# Patient Record
Sex: Female | Born: 1960 | Race: White | Hispanic: No | Smoking: Current every day smoker
Health system: Southern US, Community
[De-identification: ages and names within clinical notes are randomized; demographics above are authoritative.]

## PROBLEM LIST (undated history)

## (undated) DIAGNOSIS — F329 Major depressive disorder, single episode, unspecified: Secondary | ICD-10-CM

## (undated) DIAGNOSIS — E538 Deficiency of other specified B group vitamins: Secondary | ICD-10-CM

## (undated) DIAGNOSIS — E039 Hypothyroidism, unspecified: Secondary | ICD-10-CM

## (undated) DIAGNOSIS — F32A Depression, unspecified: Secondary | ICD-10-CM

## (undated) DIAGNOSIS — I10 Essential (primary) hypertension: Secondary | ICD-10-CM

## (undated) DIAGNOSIS — F419 Anxiety disorder, unspecified: Secondary | ICD-10-CM

## (undated) DIAGNOSIS — Q8501 Neurofibromatosis, type 1: Secondary | ICD-10-CM

## (undated) DIAGNOSIS — E669 Obesity, unspecified: Secondary | ICD-10-CM

## (undated) HISTORY — DX: Neurofibromatosis, type 1: Q85.01

## (undated) HISTORY — DX: Deficiency of other specified B group vitamins: E53.8

## (undated) HISTORY — PX: ABDOMINAL HYSTERECTOMY: SHX81

## (undated) HISTORY — PX: TUBAL LIGATION: SHX77

## (undated) HISTORY — PX: ELBOW SURGERY: SHX618

## (undated) HISTORY — DX: Obesity, unspecified: E66.9

## (undated) HISTORY — DX: Essential (primary) hypertension: I10

## (undated) HISTORY — PX: SALPINGOOPHORECTOMY: SHX82

## (undated) HISTORY — PX: THYROID SURGERY: SHX805

## (undated) HISTORY — DX: Anxiety disorder, unspecified: F41.9

## (undated) HISTORY — PX: PARTIAL HYSTERECTOMY: SHX80

## (undated) HISTORY — DX: Major depressive disorder, single episode, unspecified: F32.9

## (undated) HISTORY — DX: Depression, unspecified: F32.A

---

## 1995-07-20 HISTORY — PX: CHOLECYSTECTOMY: SHX55

## 2007-11-19 ENCOUNTER — Ambulatory Visit: Payer: Self-pay | Admitting: Cardiology

## 2007-12-08 ENCOUNTER — Ambulatory Visit: Payer: Self-pay | Admitting: Cardiology

## 2008-03-15 ENCOUNTER — Ambulatory Visit (HOSPITAL_COMMUNITY): Admission: RE | Admit: 2008-03-15 | Discharge: 2008-03-15 | Payer: Self-pay | Admitting: Internal Medicine

## 2010-12-01 NOTE — Assessment & Plan Note (Signed)
[facility]                          [facility] OFFICE NOTE   NAME:HODGEJood, Retana                           MRN:          [id number]  DATE:12/08/2007                            DOB:          [birth year]    PRIMARY CARDIOLOGIST:  Dr. Lewayne Bunting.   PRIMARY CARE PHYSICIAN:  Dr. Elfredia Nevins.   REASON FOR VISIT:  Followup cardiac testing.   HISTORY OF PRESENT ILLNESS:  Ms. Debruler is a pleasant 50 year old woman  evaluated by Dr. Andee Lineman as an inpatient consult back in early May.  She  presented at that time with left arm discomfort in the setting of known  tobacco abuse and findings of hypertension.  She also had a family  history of cardiovascular disease and neurofibromatosis.  She ruled out  for myocardial infarction and it was felt unlikely that the patient's  symptoms were clearly ischemic, although she was referred for followup  testing.  This included an echocardiogram which demonstrated a normal  left ventricular ejection fraction of 50-55% with mild mitral  regurgitation.  She also had an adenosine Cardiolite which showed no  electrocardiographic changes to suggest ischemia.  There were no  reversible perfusion defects to indicate ischemia.  Ejection fraction by  that particular study was 63%.  She comes back in the office today  stating that generally she has felt fairly well.  Her symptoms have  improved.  She is not reporting any frank chest pain.  She is due to see  Dr. Sherwood Gambler go back for full examination in the future.  She had some  questions about followup of her thyroid.  She apparently had some type  of limited thyroidectomy in the past.  I see that she had a normal TSH  back in 2004 but none recently checked.  I also spoke with her about  having her lipids checked at some point.  Electrocardiogram today shows  sinus rhythm with no acute ST-T wave changes.   ALLERGIES:  CODEINE.   MEDICATIONS:  1. Lexapro 10 mg p.o. daily.  2.  Alprazolam 1 mg p.o. p.r.n.  3. Aspirin 81 mg p.o. daily.   REVIEW OF SYSTEMS:  As in the history of present illness.  Otherwise,  negative.   EXAMINATION:  Blood pressure is 114/69 heart 78, weight is 160 pounds.  The patient is comfortable in no acute distress.  Examination neck reveals no elevated jugular venous pressure no loud  bruits.  LUNGS:  Clear without labored breathing.  CARDIAC:  Exam reveals regular rate and rhythm.  No pathologic murmur,  rub, or gallop.  EXTREMITIES:  No pitting edema.   IMPRESSION/RECOMMENDATIONS:  History of left arm discomfort, most likely  noncardiac.  The patient ruled out for myocardial infarction during  observation in the hospital and had subsequent reassuring outpatient  evaluation including a Cardiolite without evidence of ischemia and  definition of normal ventricle systolic function with only mild mitral  regurgitation.  I would recommend basic risk factor modification  strategies including smoking cessation, followup of blood pressure which  is actually normal today off medications,  and followup of lipid status.  Would also consider a followup TSH given her reported history.  She is  due to see Dr. Sherwood Gambler back in the near future and she will return to see  Dr. Andee Lineman as needed.     Jonelle Sidle, MD  Electronically Signed    SGM/MedQ  DD: 12/08/2007  DT: 12/08/2007  Job #: [id number]   cc:   Madelin Rear. Sherwood Gambler, MD  Learta Codding, MD,FACC

## 2011-04-08 ENCOUNTER — Telehealth: Payer: Self-pay

## 2011-04-08 NOTE — Telephone Encounter (Signed)
LMOM for pt to call. Referred for screening colonoscopy from Dr. Regino Schultze. Needs OV prior to scheduling due to medications.

## 2011-04-08 NOTE — Telephone Encounter (Signed)
Pt returned call and is scheduled for OV with Lorenza Burton, NP on 04/15/2011 @ 8:30 AM.

## 2011-04-15 ENCOUNTER — Encounter: Payer: Self-pay | Admitting: Urgent Care

## 2011-04-15 ENCOUNTER — Ambulatory Visit (INDEPENDENT_AMBULATORY_CARE_PROVIDER_SITE_OTHER): Payer: 59 | Admitting: Urgent Care

## 2011-04-15 VITALS — BP 150/100 | HR 87 | Temp 97.0°F | Ht 61.0 in | Wt 191.0 lb

## 2011-04-15 DIAGNOSIS — K921 Melena: Secondary | ICD-10-CM

## 2011-04-15 NOTE — Assessment & Plan Note (Addendum)
Cassandra Davidson is a 50 y.o. female  Here to set up colonoscopy.  Upon further questioning, she has had some hematochezia that is small volume.  Colonoscopy w/ Dr Darrick Penna in the near future.  Differentials include colorectal ca or polyp vs benign anorectal source.  I have discussed risks & benefits which include, but are not limited to, bleeding, infection, perforation & drug reaction.  The patient agrees with this plan & written consent will be obtained.  Procedure will need to be done with deep sedation (propofol) in the OR under the direction of anesthesia services for  Multiple psychoactive medications.  Please call Dr Edison Simon office TODAY to let them know about your high blood pressure to see if they want to change your medications or make an appt!

## 2011-04-15 NOTE — Progress Notes (Addendum)
Referring Provider: Justin Mend, [facility] Primary Care Physician:  Kirk Ruths, MD Primary Gastroenterologist:  Dr. Darrick Penna  Chief Complaint  Patient presents with  . Colonoscopy    HPI:  Cassandra Davidson is a 50 y.o. female here as a referral from Dr. Regino Schultze for screening colonoscopy.   Occasionally urgency w/ greasy foods since Gallbladder removed.   Denies any upper GI symptoms including heartburn, indigestion, nausea, vomiting, dysphagia, odynophagia or anorexia. Has seen small amts of burgundy blood on toilet paper w/ wiping x2 episodes.  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  Wt gain steadily.  Denies constipation.    Past Medical History  Diagnosis Date  . B12 deficiency   . Obesity   . Anxiety   . Von Recklinghausen's disease   . Neurofibromatosis   . Depression     Past Surgical History  Procedure Date  . Cholecystectomy 1997  . Partial hysterectomy   . Thyroid surgery     nodule  . Salpingoophorectomy     right  . Elbow surgery   . Tubal ligation     Current Outpatient Prescriptions  Medication Sig Dispense Refill  . ACETAMINOPHEN PO Take 500 mg by mouth as needed.        . ALPRAZolam (XANAX) 1 MG tablet Take 1 mg by mouth at bedtime as needed.       Marland Kitchen escitalopram (LEXAPRO) 20 MG tablet Take 20 mg by mouth daily.       . hydrochlorothiazide (MICROZIDE) 12.5 MG capsule Take 12.5 mg by mouth every morning.       . phentermine 37.5 MG capsule Take 37.5 mg by mouth. Every other day       . zolpidem (AMBIEN) 10 MG tablet Take 10 mg by mouth at bedtime as needed.         Allergies as of 04/15/2011 - Review Complete 04/15/2011  Allergen Reaction Noted  . Codeine Itching 04/15/2011    Family History:There is no known family history of colorectal carcinoma , liver disease, or inflammatory bowel disease.  Problem Relation Age of Onset  . Leukemia Father   . Heart failure Mother   . Uterine cancer Mother     History   Social History  .  Marital Status: Widowed    Spouse Name: N/A    Number of Children: 3  . 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 Smoking status: Former Smoker -- 1.0 packs/day for 30 years    Types: Cigarettes    Quit date: 12/12/2009  . Smokeless tobacco: Former Neurosurgeon    Quit date: 12/12/2009  . Alcohol Use: No     socially(very little) couple times/yr  . Drug Use: No  . Sexually Active: Not on file  Review of Systems: Gen: Denies any fever, chills, sweats, anorexia, fatigue, weakness, malaise, weight loss, and sleep disorder CV: Denies chest pain, angina, palpitations, syncope, orthopnea, PND, peripheral edema, and claudication. Resp: Bronchitis in May 12.  Denies dyspnea at rest, dyspnea with exercise, cough, sputum, wheezing, coughing up blood, and pleurisy. GI: Denies vomiting blood, jaundice, and fecal incontinence.   Denies dysphagia or odynophagia. GU : Denies urinary burning, blood in urine, urinary frequency, urinary hesitancy, nocturnal urination, and urinary incontinence. 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  Denies itching, dry skin, hives, moles, warts, or unhealing ulcers.  Psych:  c/o anxiety/depression well-controlled on meds.  Denies memory loss, suicidal ideation, hallucinations, paranoia, and confusion. Heme: Denies bruising and enlarged lymph nodes.  Physical Exam: BP 141/94  Pulse 87  Temp(Src) 97 F (36.1 C) (Temporal)  Ht 5\' 1"  (1.549 m)  Wt 191 lb (86.637 kg)  BMI 36.09 kg/m2 General:   Alert,  Well-developed, obese, pleasant and cooperative in NAD Head:  Normocephalic and atraumatic. Eyes:  Sclera clear, no icterus.   Conjunctiva pink. Ears:  Normal auditory acuity. Nose:  No deformity, discharge,  or lesions. Mouth:  No deformity or lesions, OP pink/moist. Neck:  Supple; no masses or thyromegaly. Lungs:  Clear throughout to  auscultation.   No wheezes, crackles, or rhonchi. No acute distress. Heart:  Regular rate and rhythm; no murmurs, clicks, rubs,  or gallops. Abdomen:  Soft, nontender and nondistended. No masses, hepatosplenomegaly or hernias noted. Normal bowel sounds, without guarding, and without rebound.   Rectal:  Deferred until time of colonoscopy.   Msk:  Symmetrical without gross deformities. Normal posture. Pulses:  Normal pulses noted. Extremities:  Without clubbing or edema. Neurologic:  Alert and  oriented x4;  grossly normal neurologically. Skin: Multiple nodules all over body, some flesh toned, others erythematous. Cervical Nodes:  No significant cervical adenopathy. Psych:  Alert and cooperative. Normal mood and affect.

## 2011-04-15 NOTE — Progress Notes (Signed)
Cc to PCP 

## 2011-04-15 NOTE — Patient Instructions (Addendum)
Keep colonoscopy as planned Please call Dr Edison Simon office TODAY to let them know about your high blood pressure to see if they want to change your medications or make an appt!

## 2011-04-28 ENCOUNTER — Encounter (HOSPITAL_COMMUNITY)
Admission: RE | Admit: 2011-04-28 | Discharge: 2011-04-28 | Disposition: A | Discharge status: Home / Self Care | Payer: 59 | Source: Ambulatory Visit | Attending: Gastroenterology | Admitting: Gastroenterology

## 2011-04-28 ENCOUNTER — Other Ambulatory Visit: Payer: Self-pay

## 2011-04-28 ENCOUNTER — Encounter (HOSPITAL_COMMUNITY): Payer: Self-pay

## 2011-04-28 ENCOUNTER — Telehealth: Payer: Self-pay

## 2011-04-28 LAB — CBC
MCH: 29.1 pg (ref 26.0–34.0)
MCHC: 33.9 g/dL (ref 30.0–36.0)
MCV: 85.8 fL (ref 78.0–100.0)
Platelets: 279 10*3/uL (ref 150–400)
RDW: 13.5 % (ref 11.5–15.5)

## 2011-04-28 LAB — BASIC METABOLIC PANEL
Calcium: 9.7 mg/dL (ref 8.4–10.5)
Creatinine, Ser: 0.86 mg/dL (ref 0.50–1.10)
GFR calc Af Amer: 88 mL/min — ABNORMAL LOW (ref >90)

## 2011-04-28 NOTE — Telephone Encounter (Signed)
Pt had pre-op today for colonoscopy scheduled for 05/05/2011. Pam from Endo called and said that  Potassium is low at 3.3. Please advise! (notified Dr. Darrick Penna verbally )she said to tell Pam that Dr. Jayme Cloud was the ordering provider. I called and told Pam and she said that he does not order any meds. Sometimes they will notify the PCP. Please advise!

## 2011-04-28 NOTE — Patient Instructions (Signed)
20 Cassandra Davidson  04/28/2011   Your procedure is scheduled on:  05/05/11  Report to [facility] at [place] [facility].  Call this number if you have problems the morning of surgery: [phone]   Remember:   Do not eat food:After Midnight.  Do not drink clear liquids: After Midnight.  Take these medicines the morning of surgery with A SIP OF WATER: xanax, lexapro   Do not wear jewelry, make-up or nail polish.  Do not wear lotions, powders, or perfumes. You may wear deodorant.  Do not shave 48 hours prior to surgery.  Do not bring valuables to the hospital.  Contacts, dentures or bridgework may not be worn into surgery.  Leave suitcase in the car. After surgery it may be brought to your room.  For patients admitted to the hospital, checkout time is 11:00 AM the day of discharge.   Patients discharged the day of surgery will not be allowed to drive home.  Name and phone number of your driver: family  Special Instructions: N/A   Please read over the following fact sheets that you were given: Pain Booklet, Anesthesia Post-op Instructions and Care and Recovery After Surgery   PATIENT INSTRUCTIONS POST-ANESTHESIA  IMMEDIATELY FOLLOWING SURGERY:  Do not drive or operate machinery for the first twenty four hours after surgery.  Do not make any important decisions for twenty four hours after surgery or while taking narcotic pain medications or sedatives.  If you develop intractable nausea and vomiting or a severe headache please notify your doctor immediately.  FOLLOW-UP:  Please make an appointment with your surgeon as instructed. You do not need to follow up with anesthesia unless specifically instructed to do so.  WOUND CARE INSTRUCTIONS (if applicable):  Keep a dry clean dressing on the anesthesia/puncture wound site if there is drainage.  Once the wound has quit draining you may leave it open to air.  Generally you should leave the bandage intact for twenty four hours unless there is drainage.  If the  epidural site drains for more than 36-48 hours please call the anesthesia department.  QUESTIONS?:  Please feel free to call your physician or the hospital operator if you have any questions, and they will be happy to assist you.     [facility] Anesthesia Department [address] [place] [phone]

## 2011-04-28 NOTE — Telephone Encounter (Signed)
Spoke with PAM. Dr. Jayme Cloud or his CRNAs need to follow up on labs ordered by them.

## 2011-04-28 NOTE — Pre-Procedure Instructions (Signed)
Office Lucila Maine LPN  to notify Dr Darrick Penna of K 3.3

## 2011-04-30 NOTE — Pre-Procedure Instructions (Signed)
Pt to contact Dr Sherwood Gambler about Low K

## 2011-05-05 ENCOUNTER — Encounter (HOSPITAL_COMMUNITY): Payer: Self-pay | Admitting: Anesthesiology

## 2011-05-05 ENCOUNTER — Encounter (HOSPITAL_COMMUNITY)
Admission: RE | Disposition: A | Discharge status: Home / Self Care | Payer: Self-pay | Source: Ambulatory Visit | Attending: Gastroenterology

## 2011-05-05 ENCOUNTER — Other Ambulatory Visit: Payer: Self-pay | Admitting: Gastroenterology

## 2011-05-05 ENCOUNTER — Ambulatory Visit (HOSPITAL_COMMUNITY)
Admission: RE | Admit: 2011-05-05 | Discharge: 2011-05-05 | Disposition: A | Discharge status: Home / Self Care | Payer: 59 | Source: Ambulatory Visit | Attending: Gastroenterology | Admitting: Gastroenterology

## 2011-05-05 ENCOUNTER — Ambulatory Visit (HOSPITAL_COMMUNITY): Payer: 59 | Admitting: Anesthesiology

## 2011-05-05 DIAGNOSIS — D126 Benign neoplasm of colon, unspecified: Secondary | ICD-10-CM | POA: Insufficient documentation

## 2011-05-05 DIAGNOSIS — K648 Other hemorrhoids: Secondary | ICD-10-CM | POA: Insufficient documentation

## 2011-05-05 DIAGNOSIS — Z1211 Encounter for screening for malignant neoplasm of colon: Secondary | ICD-10-CM

## 2011-05-05 DIAGNOSIS — Z01812 Encounter for preprocedural laboratory examination: Secondary | ICD-10-CM | POA: Insufficient documentation

## 2011-05-05 DIAGNOSIS — Z01810 Encounter for preprocedural cardiovascular examination: Secondary | ICD-10-CM | POA: Insufficient documentation

## 2011-05-05 DIAGNOSIS — Z79899 Other long term (current) drug therapy: Secondary | ICD-10-CM | POA: Insufficient documentation

## 2011-05-05 DIAGNOSIS — D128 Benign neoplasm of rectum: Secondary | ICD-10-CM | POA: Insufficient documentation

## 2011-05-05 DIAGNOSIS — D129 Benign neoplasm of anus and anal canal: Secondary | ICD-10-CM | POA: Insufficient documentation

## 2011-05-05 HISTORY — PX: POLYPECTOMY: SHX5525

## 2011-05-05 SURGERY — COLONOSCOPY WITH PROPOFOL
Anesthesia: Monitor Anesthesia Care

## 2011-05-05 MED ORDER — ONDANSETRON HCL 4 MG/2ML IJ SOLN
4.0000 mg | Freq: Once | INTRAMUSCULAR | Status: AC
  Administered 2011-05-05: 4 mg via INTRAVENOUS

## 2011-05-05 MED ORDER — MIDAZOLAM HCL 5 MG/5ML IJ SOLN
INTRAMUSCULAR | Status: DC | PRN
  Administered 2011-05-05: 2 mg via INTRAVENOUS

## 2011-05-05 MED ORDER — LACTATED RINGERS IV SOLN
INTRAVENOUS | Status: DC
  Administered 2011-05-05: 50 mL/h via INTRAVENOUS

## 2011-05-05 MED ORDER — LACTATED RINGERS IV SOLN: INTRAVENOUS | Status: DC

## 2011-05-05 MED ORDER — MIDAZOLAM HCL 2 MG/2ML IJ SOLN
INTRAMUSCULAR | Status: AC
  Administered 2011-05-05: 2 mg via INTRAVENOUS
  Filled 2011-05-05: qty 2

## 2011-05-05 MED ORDER — GLYCOPYRROLATE 0.2 MG/ML IJ SOLN
0.2000 mg | Freq: Once | INTRAMUSCULAR | Status: AC
  Administered 2011-05-05: 0.2 mg via INTRAVENOUS

## 2011-05-05 MED ORDER — MIDAZOLAM HCL 2 MG/2ML IJ SOLN
INTRAMUSCULAR | Status: AC
  Filled 2011-05-05: qty 2

## 2011-05-05 MED ORDER — ONDANSETRON HCL 4 MG/2ML IJ SOLN: 4.0000 mg | Freq: Once | INTRAMUSCULAR | Status: DC | PRN

## 2011-05-05 MED ORDER — LIDOCAINE HCL (PF) 1 % IJ SOLN
INTRAMUSCULAR | Status: AC
  Filled 2011-05-05: qty 5

## 2011-05-05 MED ORDER — SIMETHICONE LIQD
Status: DC | PRN
  Administered 2011-05-05: 5 mL

## 2011-05-05 MED ORDER — GLYCOPYRROLATE 0.2 MG/ML IJ SOLN
INTRAMUSCULAR | Status: AC
  Administered 2011-05-05: 0.2 mg via INTRAVENOUS
  Filled 2011-05-05: qty 1

## 2011-05-05 MED ORDER — WATER FOR IRRIGATION, STERILE IR SOLN
Status: DC | PRN
  Administered 2011-05-05: 1000 mL

## 2011-05-05 MED ORDER — LIDOCAINE HCL 1 % IJ SOLN
INTRAMUSCULAR | Status: DC | PRN
  Administered 2011-05-05: 25 mg via INTRADERMAL

## 2011-05-05 MED ORDER — MIDAZOLAM HCL 2 MG/2ML IJ SOLN
1.0000 mg | INTRAMUSCULAR | Status: AC | PRN
  Administered 2011-05-05 (×3): 2 mg via INTRAVENOUS

## 2011-05-05 MED ORDER — FENTANYL CITRATE 0.05 MG/ML IJ SOLN
25.0000 ug | INTRAMUSCULAR | Status: DC | PRN
Start: 2011-05-05 — End: 2011-05-05

## 2011-05-05 MED ORDER — PROPOFOL 10 MG/ML IV EMUL
INTRAVENOUS | Status: AC
  Filled 2011-05-05: qty 20

## 2011-05-05 MED ORDER — ONDANSETRON HCL 4 MG/2ML IJ SOLN
INTRAMUSCULAR | Status: AC
  Administered 2011-05-05: 4 mg via INTRAVENOUS
  Filled 2011-05-05: qty 2

## 2011-05-05 MED ORDER — PROPOFOL 10 MG/ML IV EMUL
INTRAVENOUS | Status: DC | PRN
  Administered 2011-05-05: 65 ug/kg/min via INTRAVENOUS

## 2011-05-05 SURGICAL SUPPLY — 22 items
ELECT REM PT RETURN 9FT ADLT (ELECTROSURGICAL)
ELECTRODE REM PT RTRN 9FT ADLT (ELECTROSURGICAL) IMPLANT
FCP BXJMBJMB 240X2.8X (CUTTING FORCEPS)
FLOOR PAD 36X40 (MISCELLANEOUS) ×3
FORCEPS BIOP RAD 4 LRG CAP 4 (CUTTING FORCEPS) ×3 IMPLANT
FORCEPS BIOP RJ4 240 W/NDL (CUTTING FORCEPS)
FORCEPS BXJMBJMB 240X2.8X (CUTTING FORCEPS) IMPLANT
INJECTOR/SNARE I SNARE (MISCELLANEOUS) IMPLANT
LUBRICANT JELLY 4.5OZ STERILE (MISCELLANEOUS) ×3 IMPLANT
MANIFOLD NEPTUNE II (INSTRUMENTS) ×3 IMPLANT
NEEDLE SCLEROTHERAPY 25GX240 (NEEDLE) IMPLANT
PAD FLOOR 36X40 (MISCELLANEOUS) ×2 IMPLANT
PROBE APC STR FIRE (PROBE) IMPLANT
PROBE INJECTION GOLD (MISCELLANEOUS)
PROBE INJECTION GOLD 7FR (MISCELLANEOUS) IMPLANT
SNARE ROTATE MED OVAL 20MM (MISCELLANEOUS) IMPLANT
SYR 50ML LL SCALE MARK (SYRINGE) ×3 IMPLANT
TRAP SPECIMEN MUCOUS 40CC (MISCELLANEOUS) IMPLANT
TUBING ENDO SMARTCAP (MISCELLANEOUS) ×3 IMPLANT
TUBING ENDO SMARTCAP PENTAX (MISCELLANEOUS) IMPLANT
TUBING IRRIGATION ENDOGATOR (MISCELLANEOUS) ×3 IMPLANT
WATER STERILE IRR 1000ML POUR (IV SOLUTION) ×6 IMPLANT

## 2011-05-05 NOTE — Anesthesia Preprocedure Evaluation (Signed)
Anesthesia Evaluation  Name, MR# and DOB Patient awake  General Assessment Comment  Reviewed: Allergy & Precautions, H&P , NPO status , Patient's Chart, lab work & pertinent test results  History of Anesthesia Complications Negative for: history of anesthetic complications  Airway Mallampati: I  Neck ROM: Full    Dental  (+) Teeth Intact   Pulmonary former smoker   Pulmonary exam normal       Cardiovascular hypertension, Pt. on medications Regular Normal    Neuro/Psych PSYCHIATRIC DISORDERS Anxiety Depression    GI/Hepatic   Endo/Other    Renal/GU      Musculoskeletal   Abdominal   Peds  Hematology   Anesthesia Other Findings   Reproductive/Obstetrics                           Anesthesia Physical Anesthesia Plan  ASA: II  Anesthesia Plan: MAC   Post-op Pain Management:    Induction: Intravenous  Airway Management Planned: Nasal Cannula  Additional Equipment:   Intra-op Plan:   Post-operative Plan:   Informed Consent: I have reviewed the patients History and Physical, chart, labs and discussed the procedure including the risks, benefits and alternatives for the proposed anesthesia with the patient or authorized representative who has indicated his/her understanding and acceptance.     Plan Discussed with:   Anesthesia Plan Comments:         Anesthesia Quick Evaluation

## 2011-05-05 NOTE — Anesthesia Postprocedure Evaluation (Signed)
  Anesthesia Post-op Note  Patient: Cassandra Davidson  Procedure(s) Performed:  COLONOSCOPY WITH PROPOFOL - 1015 in cecum; withdrawal time 18 minutes ; POLYPECTOMY  Patient Location: PACU  Anesthesia Type: MAC  Level of Consciousness: awake, alert , oriented and patient cooperative  Airway and Oxygen Therapy: Patient Spontanous Breathing  Post-op Pain: none  Post-op Assessment: Post-op Vital signs reviewed, Patient's Cardiovascular Status Stable, Respiratory Function Stable, Patent Airway and No signs of Nausea or vomiting  Post-op Vital Signs: Reviewed and stable  Complications: No apparent anesthesia complications

## 2011-05-05 NOTE — Transfer of Care (Signed)
Immediate Anesthesia Transfer of Care Note  Patient: Cassandra Davidson  Procedure(s) Performed:  COLONOSCOPY WITH PROPOFOL - 1015 in cecum; withdrawal time 18 minutes ; POLYPECTOMY  Patient Location: PACU  Anesthesia Type: MAC  Level of Consciousness: awake, alert , oriented and patient cooperative  Airway & Oxygen Therapy: Patient Spontanous Breathing  Post-op Assessment: Report given to PACU RN, Post -op Vital signs reviewed and stable and Patient moving all extremities X 4  Post vital signs: Reviewed and stable  Complications: No apparent anesthesia complications

## 2011-05-05 NOTE — H&P (Signed)
Reason for Visit     Colonoscopy        Vitals - Last Recorded       BP Pulse Temp(Src) Ht Wt BMI    150/100  87  97 F (36.1 C) (Temporal)  5\' 1"  (1.549 m)  191 lb (86.637 kg)  36.09 kg/m2       Vitals History Recorded       Progress Notes     Lorenza Burton, NP  04/16/2011  8:26 AM  Addendum Referring Provider: Justin Mend, [facility] Primary Care Physician:  Kirk Ruths, MD Primary Gastroenterologist:  Dr. Darrick Penna    Chief Complaint   Patient presents with   .  Colonoscopy      HPI:  Cassandra Davidson is a 50 y.o. female here as a referral from Dr. Regino Schultze for screening colonoscopy.   Occasionally urgency w/ greasy foods since Gallbladder removed.   Denies any upper GI symptoms including heartburn, indigestion, nausea, vomiting, dysphagia, odynophagia or anorexia. Has seen small amts of burgundy blood on toilet paper w/ wiping x2 episodes.  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  Wt gain steadily.  Denies constipation.      Past Medical History   Diagnosis  Date   .  B12 deficiency     .  Obesity     .  Anxiety     .  Von Recklinghausen's disease     .  Neurofibromatosis     .  Depression         Past Surgical History   Procedure  Date   .  Cholecystectomy  1997   .  Partial hysterectomy     .  Thyroid surgery         nodule   .  Salpingoophorectomy         right   .  Elbow surgery     .  Tubal ligation         Current Outpatient Prescriptions   Medication  Sig  Dispense  Refill   .  ACETAMINOPHEN PO  Take 500 mg by mouth as needed.           .  ALPRAZolam (XANAX) 1 MG tablet  Take 1 mg by mouth at bedtime as needed.          Marland Kitchen  escitalopram (LEXAPRO) 20 MG tablet  Take 20 mg by mouth daily.          .  hydrochlorothiazide (MICROZIDE) 12.5 MG capsule  Take 12.5 mg by mouth every morning.          .  phentermine 37.5 MG capsule  Take 37.5 mg by mouth. Every other day          .  zolpidem (AMBIEN) 10 MG tablet  Take 10 mg by mouth  at bedtime as needed.              Allergies as of 04/15/2011 - Review Complete 04/15/2011   Allergen  Reaction  Noted   .  Codeine  Itching  04/15/2011       Family History:There is no known family history of colorectal carcinoma , liver disease, or inflammatory bowel disease.   Problem  Relation  Age of Onset   .  Leukemia  Father     .  Heart failure  Mother     .  Uterine cancer  Mother  History       Social History   .  Marital Status:  Widowed       Spouse Name:  N/A       Number of Children:  3   .  Years of Education:  N/A       Occupational History   .  forklift [employer]         Social History Main Topics   .  Smoking status:  Former Smoker -- 1.0 packs/day for 30 years       Types:  Cigarettes       Quit date:  12/12/2009   .  Smokeless tobacco:  Former Neurosurgeon       Quit date:  12/12/2009   .  Alcohol Use:  No         socially(very little) couple times/yr   .  Drug Use:  No   .  Sexually Active:  Not on file    Review of Systems: Gen: Denies any fever, chills, sweats, anorexia, fatigue, weakness, malaise, weight loss, and sleep disorder CV: Denies chest pain, angina, palpitations, syncope, orthopnea, PND, peripheral edema, and claudication. Resp: Bronchitis in May 12.  Denies dyspnea at rest, dyspnea with exercise, cough, sputum, wheezing, coughing up blood, and pleurisy. GI: Denies vomiting blood, jaundice, and fecal incontinence.   Denies dysphagia or odynophagia. GU : Denies urinary burning, blood in urine, urinary frequency, urinary hesitancy, nocturnal urination, and urinary incontinence. 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  Denies itching, dry skin, hives, moles, warts, or unhealing ulcers.   Psych: c/o anxiety/depression well-controlled on meds.  Denies memory loss, suicidal ideation, hallucinations, paranoia, and confusion. Heme: Denies bruising and enlarged lymph  nodes.   Physical Exam: BP 141/94  Pulse 87  Temp(Src) 97 F (36.1 C) (Temporal)  Ht 5\' 1"  (1.549 m)  Wt 191 lb (86.637 kg)  BMI 36.09 kg/m2 General:   Alert,  Well-developed, obese, pleasant and cooperative in NAD Head:  Normocephalic and atraumatic. Eyes:  Sclera clear, no icterus.   Conjunctiva pink. Ears:  Normal auditory acuity. Nose:  No deformity, discharge,  or lesions. Mouth:  No deformity or lesions, OP pink/moist. Neck:  Supple; no masses or thyromegaly. Lungs:  Clear throughout to auscultation.   No wheezes, crackles, or rhonchi. No acute distress. Heart:  Regular rate and rhythm; no murmurs, clicks, rubs,  or gallops. Abdomen:  Soft, nontender and nondistended. No masses, hepatosplenomegaly or hernias noted. Normal bowel sounds, without guarding, and without rebound.    Rectal:  Deferred until time of colonoscopy.    Msk:  Symmetrical without gross deformities. Normal posture. Pulses:  Normal pulses noted. Extremities:  Without clubbing or edema. Neurologic:  Alert and  oriented x4;  grossly normal neurologically. Skin: Multiple nodules all over body, some flesh toned, others erythematous. Cervical Nodes:  No significant cervical adenopathy. Psych:  Alert and cooperative. Normal mood and affect.     Previous Version  Cassandra Davidson  04/15/2011 11:33 AM  Signed Cc to PCP     Hematochezia - Lorenza Burton, NP  04/16/2011  8:26 AM  Addendum Cassandra Davidson is a 50 y.o. female  Here to set up colonoscopy.  Upon further questioning, she has had some hematochezia that is small volume.  Colonoscopy w/ Dr Darrick Penna in the near future.  Differentials include colorectal ca or polyp vs benign anorectal source.  I have discussed risks & benefits which include,  but are not limited to, bleeding, infection, perforation & drug reaction.  The patient agrees with this plan & written consent will be obtained.  Procedure will need to be done with deep sedation (propofol) in the OR under the  direction of anesthesia services for   Multiple psychoactive medications.   Please call Dr Edison Simon office TODAY to let them know about your high blood pressure to see if they want to change your medications or make an appt!

## 2011-05-05 NOTE — Interval H&P Note (Signed)
History and Physical Interval Note:   05/05/2011   8:37 AM   Cassandra Davidson  has presented today for surgery, with the diagnosis of hematacheczia, polypharmacy  The various methods of treatment have been discussed with the patient and family. After consideration of risks, benefits and other options for treatment, the patient has consented to  Procedure(s): COLONOSCOPY WITH PROPOFOL as a surgical intervention .  I have reviewed the patients' chart and labs.  Questions were answered to the patient's satisfaction.     [name]  MD  THE PATIENT WAS EXAMINED AND THERE IS NO CHANGE IN THE PATIENT'S CONDITION SINCE THE ORIGINAL H&P WAS COMPLETED.

## 2011-05-10 ENCOUNTER — Encounter (HOSPITAL_COMMUNITY): Payer: Self-pay | Admitting: Gastroenterology

## 2011-05-15 ENCOUNTER — Telehealth: Payer: Self-pay | Admitting: Gastroenterology

## 2011-05-15 NOTE — Telephone Encounter (Signed)
Please call pt. She had HYPERPLASTIC POLYPS removed from her colon. TCS in 10 years. High fiber diet.  

## 2011-05-17 NOTE — Telephone Encounter (Signed)
LMOM to call back

## 2011-05-17 NOTE — Telephone Encounter (Signed)
Pt called back and told her what Dr. Darrick Penna said and had no questions

## 2011-05-17 NOTE — Telephone Encounter (Signed)
Results Cc to PCP  

## 2011-05-17 NOTE — Telephone Encounter (Signed)
Routing to Ginger to call.

## 2011-05-17 NOTE — Telephone Encounter (Signed)
Reminder in epic to have tcs in 10 years 

## 2011-06-14 NOTE — Progress Notes (Signed)
She had HYPERPLASTIC POLYPS removed from her colon. TCS in 10 years. High fiber diet.

## 2011-07-07 ENCOUNTER — Ambulatory Visit (HOSPITAL_COMMUNITY)
Admission: RE | Admit: 2011-07-07 | Discharge: 2011-07-07 | Disposition: A | Discharge status: Home / Self Care | Payer: 59 | Source: Ambulatory Visit | Attending: Internal Medicine | Admitting: Internal Medicine

## 2011-07-07 ENCOUNTER — Other Ambulatory Visit (HOSPITAL_COMMUNITY): Payer: Self-pay | Admitting: Internal Medicine

## 2011-07-07 DIAGNOSIS — R079 Chest pain, unspecified: Secondary | ICD-10-CM | POA: Insufficient documentation

## 2011-07-07 DIAGNOSIS — R05 Cough: Secondary | ICD-10-CM | POA: Insufficient documentation

## 2011-07-07 DIAGNOSIS — R059 Cough, unspecified: Secondary | ICD-10-CM | POA: Insufficient documentation

## 2012-05-18 ENCOUNTER — Other Ambulatory Visit (HOSPITAL_COMMUNITY): Payer: Self-pay | Admitting: Family Medicine

## 2012-05-18 DIAGNOSIS — Z139 Encounter for screening, unspecified: Secondary | ICD-10-CM

## 2012-05-26 ENCOUNTER — Ambulatory Visit (HOSPITAL_COMMUNITY)
Admission: RE | Admit: 2012-05-26 | Discharge: 2012-05-26 | Disposition: A | Discharge status: Home / Self Care | Payer: 59 | Source: Ambulatory Visit | Attending: Family Medicine | Admitting: Family Medicine

## 2012-05-26 DIAGNOSIS — Z1231 Encounter for screening mammogram for malignant neoplasm of breast: Secondary | ICD-10-CM | POA: Insufficient documentation

## 2012-05-26 DIAGNOSIS — Z139 Encounter for screening, unspecified: Secondary | ICD-10-CM

## 2013-01-30 ENCOUNTER — Encounter: Payer: Self-pay | Admitting: Gastroenterology

## 2013-02-01 ENCOUNTER — Telehealth: Payer: Self-pay | Admitting: Gastroenterology

## 2013-02-01 ENCOUNTER — Ambulatory Visit: Payer: 59 | Admitting: Gastroenterology

## 2013-02-01 NOTE — Telephone Encounter (Signed)
Please send letter for f/u.  

## 2013-02-01 NOTE — Telephone Encounter (Signed)
Patient was a no-show 

## 2013-02-06 ENCOUNTER — Encounter: Payer: Self-pay | Admitting: General Practice

## 2013-02-06 NOTE — Telephone Encounter (Signed)
LETTER MAILED

## 2013-11-29 ENCOUNTER — Other Ambulatory Visit (HOSPITAL_COMMUNITY): Payer: Self-pay | Admitting: Family Medicine

## 2013-11-29 DIAGNOSIS — M5430 Sciatica, unspecified side: Secondary | ICD-10-CM

## 2013-11-29 DIAGNOSIS — Q8501 Neurofibromatosis, type 1: Secondary | ICD-10-CM

## 2013-11-30 ENCOUNTER — Ambulatory Visit (HOSPITAL_COMMUNITY)
Admission: RE | Admit: 2013-11-30 | Discharge: 2013-11-30 | Disposition: A | Discharge status: Home / Self Care | Payer: 59 | Source: Ambulatory Visit | Attending: Family Medicine | Admitting: Family Medicine

## 2013-11-30 DIAGNOSIS — M5430 Sciatica, unspecified side: Secondary | ICD-10-CM

## 2013-11-30 DIAGNOSIS — M5126 Other intervertebral disc displacement, lumbar region: Secondary | ICD-10-CM | POA: Insufficient documentation

## 2013-11-30 DIAGNOSIS — M545 Low back pain, unspecified: Secondary | ICD-10-CM | POA: Insufficient documentation

## 2013-11-30 DIAGNOSIS — Q8501 Neurofibromatosis, type 1: Secondary | ICD-10-CM | POA: Insufficient documentation

## 2013-12-03 ENCOUNTER — Other Ambulatory Visit (HOSPITAL_COMMUNITY): Payer: 59

## 2014-02-08 ENCOUNTER — Other Ambulatory Visit (HOSPITAL_COMMUNITY): Payer: Self-pay | Admitting: Physician Assistant

## 2014-02-08 DIAGNOSIS — Z0000 Encounter for general adult medical examination without abnormal findings: Secondary | ICD-10-CM

## 2014-02-13 ENCOUNTER — Ambulatory Visit (HOSPITAL_COMMUNITY)
Admission: RE | Admit: 2014-02-13 | Discharge: 2014-02-13 | Disposition: A | Discharge status: Home / Self Care | Payer: 59 | Source: Ambulatory Visit | Attending: Physician Assistant | Admitting: Physician Assistant

## 2014-02-13 DIAGNOSIS — Z1231 Encounter for screening mammogram for malignant neoplasm of breast: Secondary | ICD-10-CM | POA: Diagnosis not present

## 2014-02-13 DIAGNOSIS — Z0000 Encounter for general adult medical examination without abnormal findings: Secondary | ICD-10-CM

## 2015-08-04 ENCOUNTER — Other Ambulatory Visit (HOSPITAL_COMMUNITY): Payer: Self-pay | Admitting: Physician Assistant

## 2015-08-04 DIAGNOSIS — Z1231 Encounter for screening mammogram for malignant neoplasm of breast: Secondary | ICD-10-CM

## 2015-08-11 ENCOUNTER — Ambulatory Visit (HOSPITAL_COMMUNITY): Payer: Self-pay

## 2015-11-21 DIAGNOSIS — Z6831 Body mass index (BMI) 31.0-31.9, adult: Secondary | ICD-10-CM | POA: Diagnosis not present

## 2015-11-21 DIAGNOSIS — G4730 Sleep apnea, unspecified: Secondary | ICD-10-CM | POA: Diagnosis not present

## 2015-11-21 DIAGNOSIS — F321 Major depressive disorder, single episode, moderate: Secondary | ICD-10-CM | POA: Diagnosis not present

## 2015-11-21 DIAGNOSIS — F419 Anxiety disorder, unspecified: Secondary | ICD-10-CM | POA: Diagnosis not present

## 2015-11-28 ENCOUNTER — Ambulatory Visit (HOSPITAL_COMMUNITY)
Admission: RE | Admit: 2015-11-28 | Discharge: 2015-11-28 | Disposition: A | Discharge status: Home / Self Care | Payer: Self-pay | Source: Ambulatory Visit | Attending: Physician Assistant | Admitting: Physician Assistant

## 2015-11-28 DIAGNOSIS — Z1231 Encounter for screening mammogram for malignant neoplasm of breast: Secondary | ICD-10-CM | POA: Diagnosis not present

## 2015-11-28 DIAGNOSIS — R928 Other abnormal and inconclusive findings on diagnostic imaging of breast: Secondary | ICD-10-CM | POA: Insufficient documentation

## 2015-12-02 ENCOUNTER — Other Ambulatory Visit: Payer: Self-pay | Admitting: Physician Assistant

## 2015-12-02 DIAGNOSIS — R928 Other abnormal and inconclusive findings on diagnostic imaging of breast: Secondary | ICD-10-CM

## 2015-12-03 ENCOUNTER — Other Ambulatory Visit (HOSPITAL_COMMUNITY): Payer: Self-pay | Admitting: Physician Assistant

## 2015-12-09 ENCOUNTER — Ambulatory Visit (HOSPITAL_COMMUNITY)
Admission: RE | Admit: 2015-12-09 | Discharge: 2015-12-09 | Disposition: A | Discharge status: Home / Self Care | Payer: BLUE CROSS/BLUE SHIELD | Source: Ambulatory Visit | Attending: Physician Assistant | Admitting: Physician Assistant

## 2015-12-09 DIAGNOSIS — N6489 Other specified disorders of breast: Secondary | ICD-10-CM | POA: Diagnosis not present

## 2015-12-09 DIAGNOSIS — R928 Other abnormal and inconclusive findings on diagnostic imaging of breast: Secondary | ICD-10-CM | POA: Diagnosis not present

## 2015-12-09 DIAGNOSIS — N63 Unspecified lump in breast: Secondary | ICD-10-CM | POA: Insufficient documentation

## 2015-12-09 DIAGNOSIS — Z719 Counseling, unspecified: Secondary | ICD-10-CM | POA: Diagnosis not present

## 2015-12-25 DIAGNOSIS — G4730 Sleep apnea, unspecified: Secondary | ICD-10-CM | POA: Diagnosis not present

## 2015-12-25 DIAGNOSIS — G4733 Obstructive sleep apnea (adult) (pediatric): Secondary | ICD-10-CM | POA: Diagnosis not present

## 2016-02-13 DIAGNOSIS — Z6830 Body mass index (BMI) 30.0-30.9, adult: Secondary | ICD-10-CM | POA: Diagnosis not present

## 2016-02-13 DIAGNOSIS — F419 Anxiety disorder, unspecified: Secondary | ICD-10-CM | POA: Diagnosis not present

## 2016-02-13 DIAGNOSIS — F329 Major depressive disorder, single episode, unspecified: Secondary | ICD-10-CM | POA: Diagnosis not present

## 2016-02-13 DIAGNOSIS — I10 Essential (primary) hypertension: Secondary | ICD-10-CM | POA: Diagnosis not present

## 2016-02-13 DIAGNOSIS — Z1389 Encounter for screening for other disorder: Secondary | ICD-10-CM | POA: Diagnosis not present

## 2016-02-13 DIAGNOSIS — Z23 Encounter for immunization: Secondary | ICD-10-CM | POA: Diagnosis not present

## 2016-05-06 DIAGNOSIS — R233 Spontaneous ecchymoses: Secondary | ICD-10-CM | POA: Diagnosis not present

## 2016-05-28 DIAGNOSIS — E748 Other specified disorders of carbohydrate metabolism: Secondary | ICD-10-CM | POA: Diagnosis not present

## 2016-05-28 DIAGNOSIS — Z23 Encounter for immunization: Secondary | ICD-10-CM | POA: Diagnosis not present

## 2016-05-28 DIAGNOSIS — F419 Anxiety disorder, unspecified: Secondary | ICD-10-CM | POA: Diagnosis not present

## 2016-05-28 DIAGNOSIS — Z1389 Encounter for screening for other disorder: Secondary | ICD-10-CM | POA: Diagnosis not present

## 2016-05-28 DIAGNOSIS — R51 Headache: Secondary | ICD-10-CM | POA: Diagnosis not present

## 2016-05-28 DIAGNOSIS — Z6830 Body mass index (BMI) 30.0-30.9, adult: Secondary | ICD-10-CM | POA: Diagnosis not present

## 2016-05-28 DIAGNOSIS — Q8500 Neurofibromatosis, unspecified: Secondary | ICD-10-CM | POA: Diagnosis not present

## 2016-05-31 ENCOUNTER — Other Ambulatory Visit (HOSPITAL_COMMUNITY): Payer: Self-pay | Admitting: Internal Medicine

## 2016-05-31 DIAGNOSIS — R519 Headache, unspecified: Secondary | ICD-10-CM

## 2016-05-31 DIAGNOSIS — R51 Headache: Principal | ICD-10-CM

## 2016-06-16 ENCOUNTER — Encounter: Payer: Self-pay | Admitting: Orthopaedic Surgery

## 2016-06-16 ENCOUNTER — Ambulatory Visit (INDEPENDENT_AMBULATORY_CARE_PROVIDER_SITE_OTHER): Payer: BLUE CROSS/BLUE SHIELD | Admitting: Orthopaedic Surgery

## 2016-06-16 VITALS — BP 137/80 | HR 91 | Ht 61.5 in | Wt 167.0 lb

## 2016-06-16 DIAGNOSIS — F17210 Nicotine dependence, cigarettes, uncomplicated: Secondary | ICD-10-CM | POA: Diagnosis not present

## 2016-06-16 DIAGNOSIS — Q8501 Neurofibromatosis, type 1: Secondary | ICD-10-CM | POA: Diagnosis not present

## 2016-06-16 DIAGNOSIS — M6530 Trigger finger, unspecified finger: Secondary | ICD-10-CM | POA: Diagnosis not present

## 2016-06-16 NOTE — Progress Notes (Signed)
Subjective:    Patient ID: Cassandra Davidson, female    DOB: October 09, 1960, 55 y.o.   MRN: [id number]  HPI She has triggering of the right long finger that has been present about six months.  It is getting worse and locking up, usually first thing in the morning.  It awakens her with the pain.  She has no trauma.  She saw Dr. Gerarda Fraction for this and he asked she come here.  I have explained what a trigger finger is and the treatment options including injection which can last a short time and surgery which will solve the problem.  The surgery would be outpatient.  She has elected to have injection.   Review of Systems  HENT: Negative for congestion.   Respiratory: Negative for cough and shortness of breath.   Cardiovascular: Negative for chest pain and leg swelling.  Endocrine: Positive for cold intolerance.  Musculoskeletal: Positive for arthralgias.  Allergic/Immunologic: Positive for environmental allergies.  Psychiatric/Behavioral: The patient is nervous/anxious.    Past Medical History:  Diagnosis Date  . Anxiety   . B12 deficiency   . Depression   . Depression   . Hypertension   . Neurofibromatosis   . Obesity   . Von Recklinghausen's disease [facility])     Past Surgical History:  Procedure Laterality Date  . ABDOMINAL HYSTERECTOMY    . CHOLECYSTECTOMY  1997  . ELBOW SURGERY    . PARTIAL HYSTERECTOMY    . POLYPECTOMY  05/05/2011   [id number] Hemorrhoids/sessile polyps  . SALPINGOOPHORECTOMY     right  . THYROID SURGERY     nodule  . TUBAL LIGATION      Current Outpatient Prescriptions on File Prior to Visit  Medication Sig Dispense Refill  . acetaminophen (TYLENOL) 500 MG tablet Take 500 mg by mouth every 6 (six) hours as needed. For pain     . ALPRAZolam (XANAX) 1 MG tablet Take 1 mg by mouth 4 (four) times daily as needed. For anxiety    . zolpidem (AMBIEN) 10 MG tablet Take 10 mg by mouth at bedtime as needed. For sleep    . escitalopram (LEXAPRO) 20 MG tablet Take 20 mg  by mouth 2 (two) times daily.      No current facility-administered medications on file prior to visit.     Social History   Social History  . Marital status: Widowed    Spouse name: N/A  . Number of children: 3  . 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 Smoking status: Current Every Day Smoker    Packs/day: 1.00    Years: 30.00    Types: Cigarettes    Last attempt to quit: 12/12/2009  . Smokeless tobacco: Former Systems developer    Quit date: 12/12/2009  . Alcohol use No     Comment: socially(very little) couple times/yr  . Drug use: No  . Sexual activity: Not on file   Other Topics Concern  . Not on file   Social History Narrative  . No narrative on file    Family History  Problem Relation Age of Onset  . Heart failure Mother   . Uterine cancer Mother   . Leukemia Father   . Ulcers Father   . Hypertension Sister     BP 137/80   Pulse 91   Ht 5' 1.5" (1.562 m)   Wt 167 lb (75.8 kg)   BMI 31.04 kg/m  Objective:   Physical Exam  Constitutional: She is oriented to person, place, and time. She appears well-developed and well-nourished.  HENT:  Head: Normocephalic and atraumatic.  Eyes: Conjunctivae and EOM are normal. Pupils are equal, round, and reactive to light.  Neck: Normal range of motion. Neck supple.  Cardiovascular: Normal rate, regular rhythm and intact distal pulses.   Pulmonary/Chest: Effort normal.  Abdominal: Soft.  Musculoskeletal: She exhibits tenderness (Right long finger with triggering.  NV intact. ROM full of other fingers both hands.).  Neurological: She is alert and oriented to person, place, and time. She displays normal reflexes. No cranial nerve deficit. She exhibits normal muscle tone. Coordination normal.  Skin: Skin is warm and dry.  She has neurofibromatosis.   Psychiatric: She has a normal mood and affect. Her behavior is normal. Judgment and thought content normal.    She  smokes and is not willing to cut back at this time.      Assessment & Plan:   Encounter Diagnoses  Name Primary?  . Trigger finger, acquired Yes  . Cigarette nicotine dependence without complication   . Von Recklinghausen's disease ([place])    Procedure note: After permission from the patient and sterile prep of the right hand, the area around the A1 pulley was injected by sterile technique with 1% Xylocaine and 1 cc DepoMedrol 40 tolerated well.  I will see her as needed.  Call if any problem.  Electronically Signed Sanjuana Kava, MD 11/29/20178:59 AM

## 2016-06-18 ENCOUNTER — Ambulatory Visit (HOSPITAL_COMMUNITY): Payer: BLUE CROSS/BLUE SHIELD

## 2016-07-30 DIAGNOSIS — F419 Anxiety disorder, unspecified: Secondary | ICD-10-CM | POA: Diagnosis not present

## 2016-07-30 DIAGNOSIS — I10 Essential (primary) hypertension: Secondary | ICD-10-CM | POA: Diagnosis not present

## 2016-07-30 DIAGNOSIS — Z6831 Body mass index (BMI) 31.0-31.9, adult: Secondary | ICD-10-CM | POA: Diagnosis not present

## 2016-07-30 DIAGNOSIS — G4730 Sleep apnea, unspecified: Secondary | ICD-10-CM | POA: Diagnosis not present

## 2016-11-04 DIAGNOSIS — S63502A Unspecified sprain of left wrist, initial encounter: Secondary | ICD-10-CM | POA: Diagnosis not present

## 2016-11-04 DIAGNOSIS — S6992XA Unspecified injury of left wrist, hand and finger(s), initial encounter: Secondary | ICD-10-CM | POA: Diagnosis not present

## 2016-11-04 DIAGNOSIS — M79642 Pain in left hand: Secondary | ICD-10-CM | POA: Diagnosis not present

## 2016-11-08 DIAGNOSIS — Z6829 Body mass index (BMI) 29.0-29.9, adult: Secondary | ICD-10-CM | POA: Diagnosis not present

## 2016-11-08 DIAGNOSIS — F419 Anxiety disorder, unspecified: Secondary | ICD-10-CM | POA: Diagnosis not present

## 2016-11-08 DIAGNOSIS — Z1389 Encounter for screening for other disorder: Secondary | ICD-10-CM | POA: Diagnosis not present

## 2016-11-08 DIAGNOSIS — F329 Major depressive disorder, single episode, unspecified: Secondary | ICD-10-CM | POA: Diagnosis not present

## 2016-11-17 DIAGNOSIS — R42 Dizziness and giddiness: Secondary | ICD-10-CM | POA: Diagnosis not present

## 2016-11-17 DIAGNOSIS — J0100 Acute maxillary sinusitis, unspecified: Secondary | ICD-10-CM | POA: Diagnosis not present

## 2016-12-17 DIAGNOSIS — Z1389 Encounter for screening for other disorder: Secondary | ICD-10-CM | POA: Diagnosis not present

## 2016-12-17 DIAGNOSIS — E663 Overweight: Secondary | ICD-10-CM | POA: Diagnosis not present

## 2016-12-17 DIAGNOSIS — Q8500 Neurofibromatosis, unspecified: Secondary | ICD-10-CM | POA: Diagnosis not present

## 2016-12-17 DIAGNOSIS — J329 Chronic sinusitis, unspecified: Secondary | ICD-10-CM | POA: Diagnosis not present

## 2016-12-17 DIAGNOSIS — Z6829 Body mass index (BMI) 29.0-29.9, adult: Secondary | ICD-10-CM | POA: Diagnosis not present

## 2016-12-17 DIAGNOSIS — F329 Major depressive disorder, single episode, unspecified: Secondary | ICD-10-CM | POA: Diagnosis not present

## 2016-12-17 DIAGNOSIS — F419 Anxiety disorder, unspecified: Secondary | ICD-10-CM | POA: Diagnosis not present

## 2016-12-17 DIAGNOSIS — G4700 Insomnia, unspecified: Secondary | ICD-10-CM | POA: Diagnosis not present

## 2017-02-21 DIAGNOSIS — R110 Nausea: Secondary | ICD-10-CM | POA: Diagnosis not present

## 2017-02-21 DIAGNOSIS — I10 Essential (primary) hypertension: Secondary | ICD-10-CM | POA: Diagnosis not present

## 2017-02-24 DIAGNOSIS — F321 Major depressive disorder, single episode, moderate: Secondary | ICD-10-CM | POA: Diagnosis not present

## 2017-02-24 DIAGNOSIS — Z6829 Body mass index (BMI) 29.0-29.9, adult: Secondary | ICD-10-CM | POA: Diagnosis not present

## 2017-02-24 DIAGNOSIS — Z1389 Encounter for screening for other disorder: Secondary | ICD-10-CM | POA: Diagnosis not present

## 2017-02-24 DIAGNOSIS — R55 Syncope and collapse: Secondary | ICD-10-CM | POA: Diagnosis not present

## 2017-02-24 DIAGNOSIS — Q8501 Neurofibromatosis, type 1: Secondary | ICD-10-CM | POA: Diagnosis not present

## 2017-02-24 DIAGNOSIS — E663 Overweight: Secondary | ICD-10-CM | POA: Diagnosis not present

## 2017-02-24 DIAGNOSIS — R51 Headache: Secondary | ICD-10-CM | POA: Diagnosis not present

## 2017-02-24 DIAGNOSIS — I959 Hypotension, unspecified: Secondary | ICD-10-CM | POA: Diagnosis not present

## 2017-02-28 DIAGNOSIS — Z1389 Encounter for screening for other disorder: Secondary | ICD-10-CM | POA: Diagnosis not present

## 2017-02-28 DIAGNOSIS — R55 Syncope and collapse: Secondary | ICD-10-CM | POA: Diagnosis not present

## 2017-03-24 ENCOUNTER — Ambulatory Visit (INDEPENDENT_AMBULATORY_CARE_PROVIDER_SITE_OTHER): Payer: BLUE CROSS/BLUE SHIELD | Admitting: Cardiovascular Disease

## 2017-03-24 ENCOUNTER — Encounter: Payer: Self-pay | Admitting: Cardiovascular Disease

## 2017-03-24 VITALS — BP 130/100 | HR 103 | Ht 61.5 in | Wt 162.0 lb

## 2017-03-24 DIAGNOSIS — Z716 Tobacco abuse counseling: Secondary | ICD-10-CM | POA: Diagnosis not present

## 2017-03-24 DIAGNOSIS — I10 Essential (primary) hypertension: Secondary | ICD-10-CM

## 2017-03-24 DIAGNOSIS — R55 Syncope and collapse: Secondary | ICD-10-CM

## 2017-03-24 NOTE — Progress Notes (Signed)
CARDIOLOGY CONSULT NOTE  Patient ID: Cassandra Davidson MRN: [id number] DOB/AGE: 1960-08-23 56 y.o.  Admit date: (Not on file) Primary Physician: Redmond School, MD Referring Physician: Gerarda Fraction  Reason for Consultation: Presyncope  HPI: Cassandra Davidson is a 56 y.o. female who is being seen today for the evaluation of presyncope at the request of Redmond School, MD.   She has a history of hypertension and neurofibromatosis (Von Recklinghausen's disease).  She works at [employer] and says conditions are very hot and humid. She normally sweats a lot but felt like she was profusely sweating for about an hour and then felt like she was going to pass out. She said her blood pressure was checked at work and was found to be 88/78 with a heart rate of 101 bpm and oxygen saturations of 98%. She tells me she was evaluated at an urgent care in [place] and was told to see her PCP. She said her PCP check a 24-hour urine but I do not have these results.  She normally doesn't drink water but drinks 2 bottles sugar laden carbonated beverages daily (Sun Drop) and drinks 2 cups of coffee every morning. She was prescribed lisinopril-hydrochlorothiazide and took a tablet in the evening before.  She denies exertional chest pain and shortness of breath. She seldom has palpitations. She denies any history of syncope.  She smokes one pack of cigarettes daily and has done so for 44 years.  ECG performed in the office today which I ordered and personally interpreted demonstrates normal sinus rhythm with no ischemic ST segment or T-wave abnormalities, nor any arrhythmias.   Labs 02/24/17: White blood cells 8.6, hemoglobin 14.5, platelets 309, UN 34, creatinine 0.91, BUN/creatinine ratio 37 which is elevated, sodium 142, potassium 4.3, chloride 104, bicarbonate 29, albumin 4.1, AST 20, ALT 23, random cortisol 10.3. I reviewed the results of a positive sleep study in June 2017.   Allergies  Allergen Reactions  . Codeine  Itching    Current Outpatient Prescriptions  Medication Sig Dispense Refill  . ALPRAZolam (XANAX) 1 MG tablet Take 1 mg by mouth 4 (four) times daily as needed. For anxiety    . buPROPion (WELLBUTRIN SR) 150 MG 12 hr tablet Take 150 mg by mouth 2 (two) times daily.    Marland Kitchen lisinopril-hydrochlorothiazide (PRINZIDE,ZESTORETIC) 10-12.5 MG tablet Take 1 tablet by mouth. Has not taken in 2-3 weeks     No current facility-administered medications for this visit.     Past Medical History:  Diagnosis Date  . Anxiety   . B12 deficiency   . Depression   . Depression   . Hypertension   . Neurofibromatosis   . Obesity   . Von Recklinghausen's disease [facility])     Past Surgical History:  Procedure Laterality Date  . ABDOMINAL HYSTERECTOMY    . CHOLECYSTECTOMY  1997  . ELBOW SURGERY    . PARTIAL HYSTERECTOMY    . POLYPECTOMY  05/05/2011   [id number] Hemorrhoids/sessile polyps  . SALPINGOOPHORECTOMY     right  . THYROID SURGERY     nodule  . TUBAL LIGATION      Social History   Social History  . Marital status: Widowed    Spouse name: N/A  . Number of children: 3  . 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 Smoking status: Current Every Day Smoker    Packs/day: 1.00    Years:  30.00    Types: Cigarettes    Last attempt to quit: 12/12/2009  . Smokeless tobacco: Former Systems developer    Quit date: 12/12/2009  . Alcohol use No     Comment: socially(very little) couple times/yr  . Drug use: No  . Sexual activity: Not on file   Other Topics Concern  . Not on file   Social History Narrative  . No narrative on file     No family history of premature CAD in 1st degree relatives.  Current Meds  Medication Sig  . ALPRAZolam (XANAX) 1 MG tablet Take 1 mg by mouth 4 (four) times daily as needed. For anxiety  . buPROPion (WELLBUTRIN SR) 150 MG 12 hr tablet Take 150 mg by mouth 2 (two) times daily.  Marland Kitchen lisinopril-hydrochlorothiazide  (PRINZIDE,ZESTORETIC) 10-12.5 MG tablet Take 1 tablet by mouth. Has not taken in 2-3 weeks  . [DISCONTINUED] acetaminophen (TYLENOL) 500 MG tablet Take 500 mg by mouth every 6 (six) hours as needed. For pain   . [DISCONTINUED] venlafaxine (EFFEXOR) 37.5 MG tablet Take 37.5 mg by mouth 2 (two) times daily.      Review of systems complete and found to be negative unless listed above in HPI    Physical exam Blood pressure (!) 130/100, pulse (!) 103, height 5' 1.5" (1.562 m), weight 162 lb (73.5 kg), SpO2 98 %. General: NAD Neck: No JVD, no thyromegaly or thyroid nodule.  Lungs: Clear to auscultation bilaterally with normal respiratory effort. CV: Nondisplaced PMI. Regular rate and rhythm, normal S1/S2, no S3/S4, no murmur.  No peripheral edema.  No carotid bruit.    Abdomen: Soft, nontender, no distention.  Skin: Diffuse neurofibromas.  Neurologic: Alert and oriented x 3.  Psych: Normal affect. Extremities: No clubbing or cyanosis.  HEENT: Normal.   ECG: Most recent ECG reviewed.   Labs: Lab Results  Component Value Date/Time   K 3.3 (L) 04/28/2011 10:30 AM   BUN 21 04/28/2011 10:30 AM   CREATININE 0.86 04/28/2011 10:30 AM   HGB 14.3 04/28/2011 10:30 AM     Lipids: No results found for: LDLCALC, LDLDIRECT, CHOL, TRIG, HDL      ASSESSMENT AND PLAN:  1. Presyncope: This was very likely vasovagal in etiology. Her BUN/creatinine ratio reflects that she was dehydrated. She drinks sugar laden carbonated beverages daily and 2 cups of coffee and had taken a diuretic the evening before this occurrence. ECG is normal. Physical exam with respect to her cardiovascular system is also normal. I have recommended she stop drinking sugar laden carbonated beverages and hydrate with water instead. She works in very hot and humid conditions. I would avoid the use of a diuretic for the treatment of hypertension in her circumstance. No cardia vascular testing is indicated at this time.  2.  Hypertension: Elevated. Given her hot and humid working conditions and her lack of water intake, I would avoid the use of a diuretic.  3. Tobacco abuse: Cessation counseling provided (3 minutes).    Disposition: Follow up prn.   Signed: Kate Sable, M.D., F.A.C.C.  03/24/2017, 9:15 AM

## 2017-03-24 NOTE — Patient Instructions (Signed)
Medication Instructions:  Continue all current medications.  Labwork: none  Testing/Procedures: none  Follow-Up: As needed.    Any Other Special Instructions Will Be Listed Below (If Applicable).  If you need a refill on your cardiac medications before your next appointment, please call your pharmacy.  

## 2017-05-09 DIAGNOSIS — M25462 Effusion, left knee: Secondary | ICD-10-CM | POA: Diagnosis not present

## 2017-05-09 DIAGNOSIS — S86912A Strain of unspecified muscle(s) and tendon(s) at lower leg level, left leg, initial encounter: Secondary | ICD-10-CM | POA: Diagnosis not present

## 2017-05-09 DIAGNOSIS — S8992XA Unspecified injury of left lower leg, initial encounter: Secondary | ICD-10-CM | POA: Diagnosis not present

## 2017-05-27 IMAGING — US US BREAST*L* LIMITED INC AXILLA
1 series · 8 of 8 positions shown · non-contrast
Comparison: Previous exam(s).

CLINICAL DATA: Possible architectural distortion in the 12 o'clock
retroareolar region of the left breast on a recent screening
mammogram.

EXAM:
2D DIGITAL DIAGNOSTIC LEFT MAMMOGRAM WITH ADJUNCT TOMO
ULTRASOUND LEFT BREAST

[Series 1: us breast*left* limited inc axilla · 0.07mm/px · 8 of 8 slices shown]
[im 1/8]
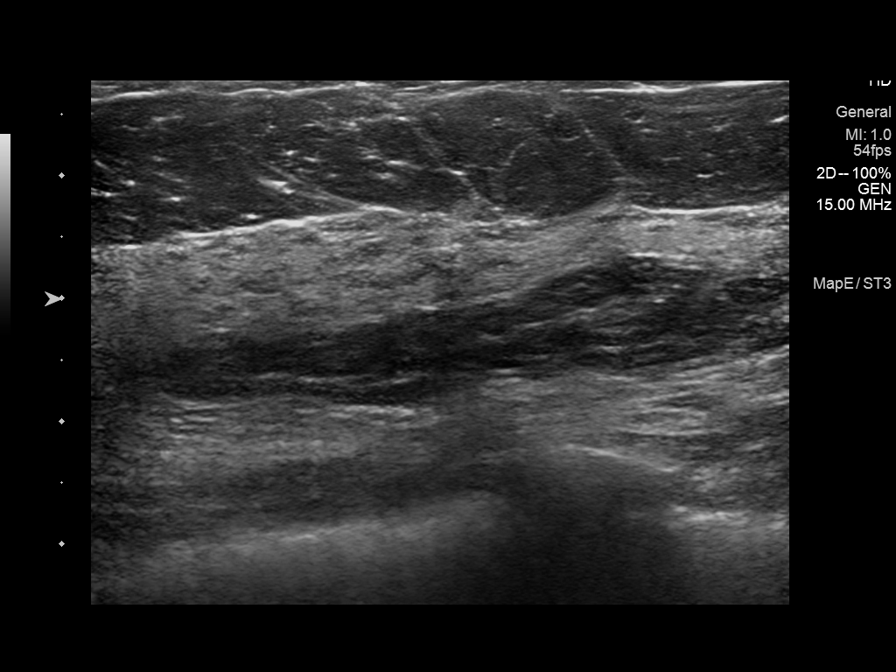
[im 2/8]
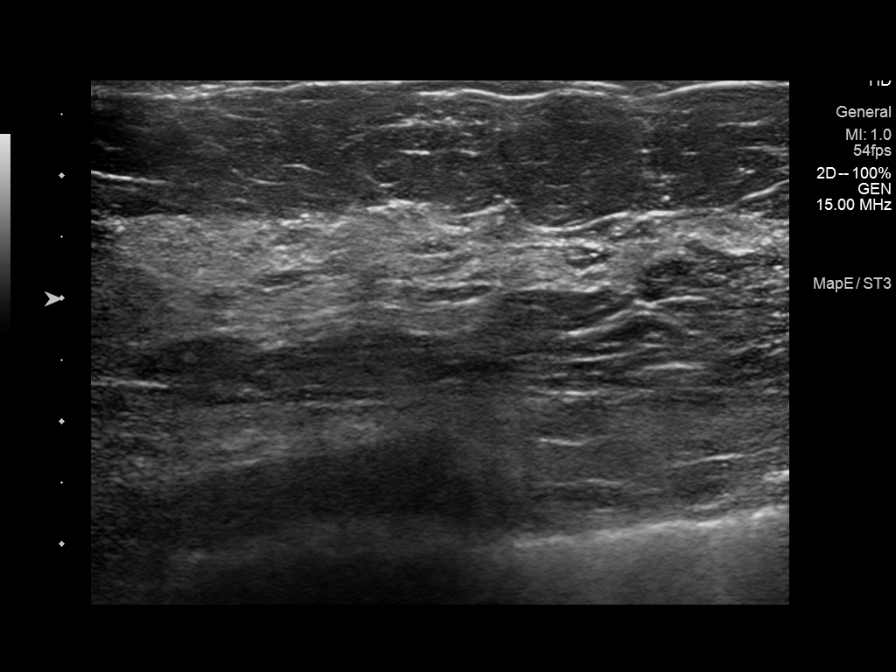
[im 3/8]
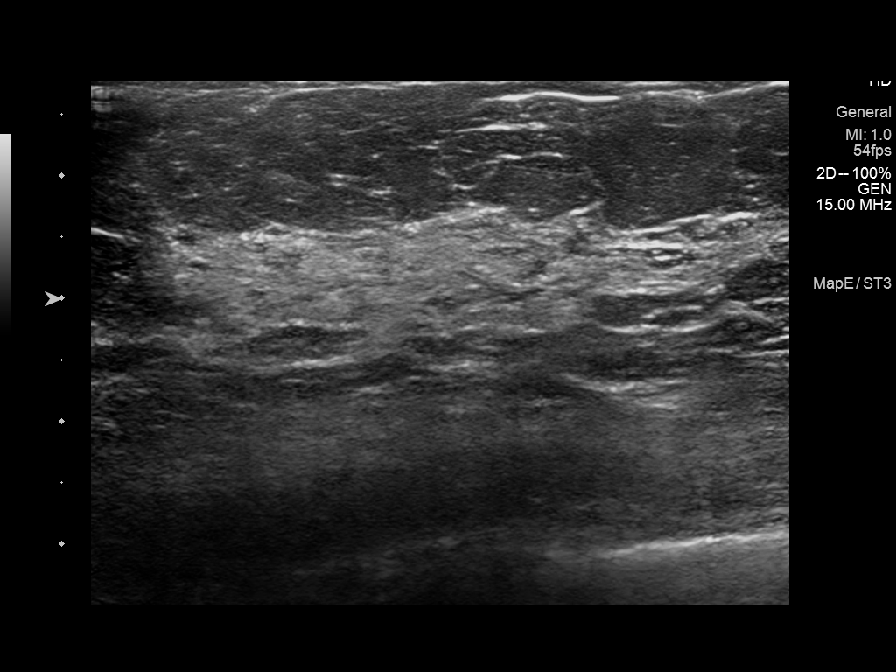
[im 4/8]
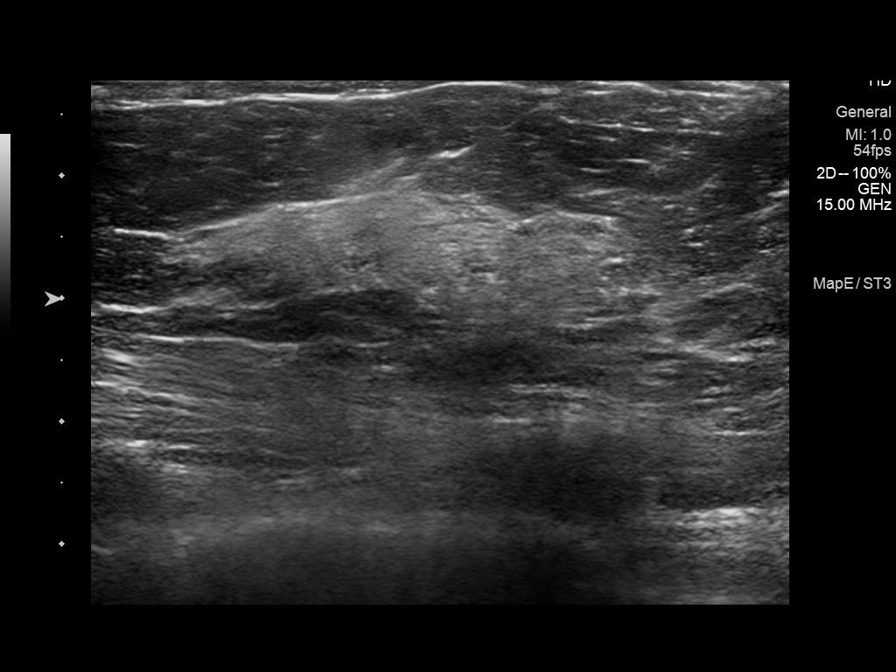
[im 5/8]
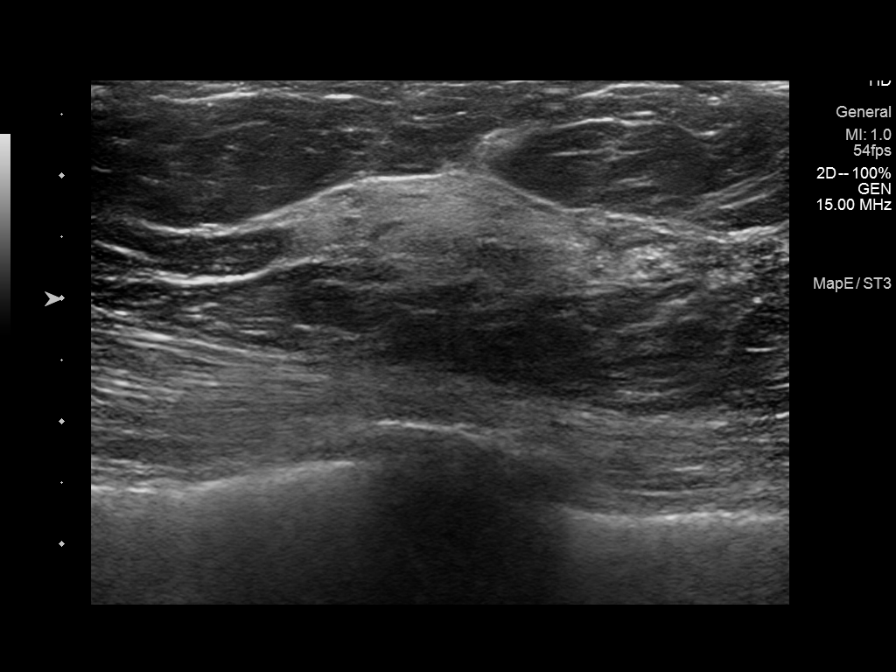
[im 6/8]
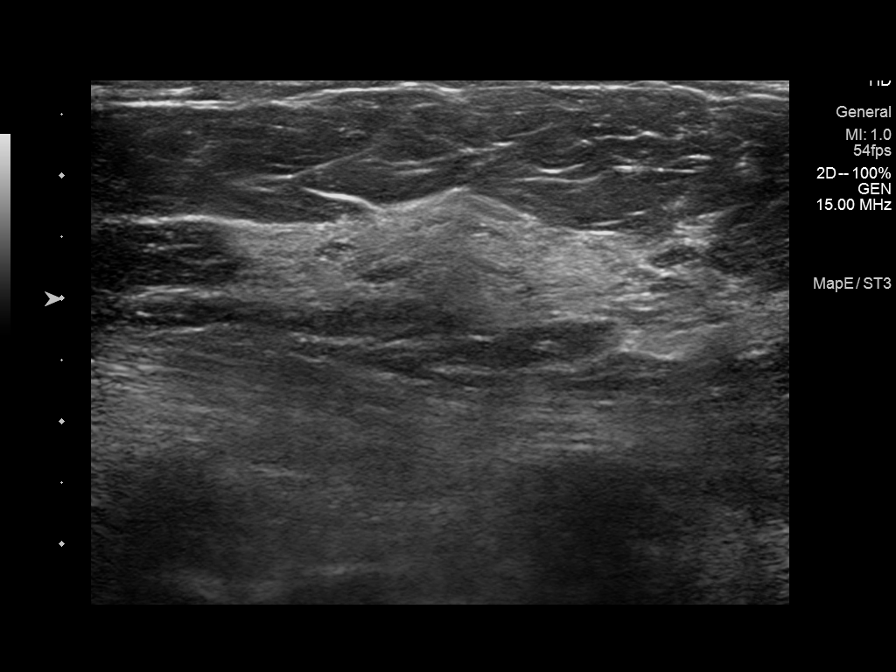
[im 7/8]
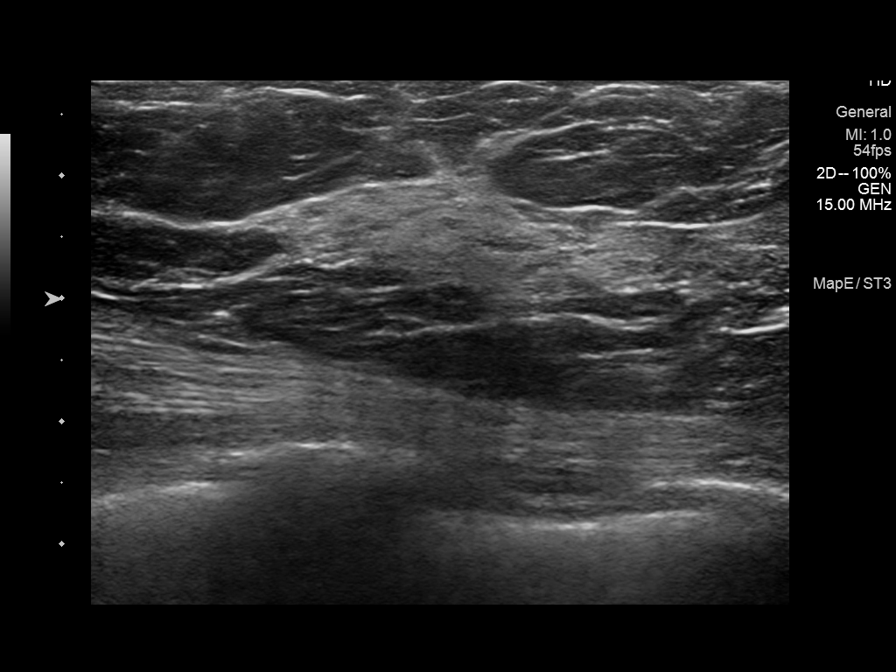
[im 8/8]
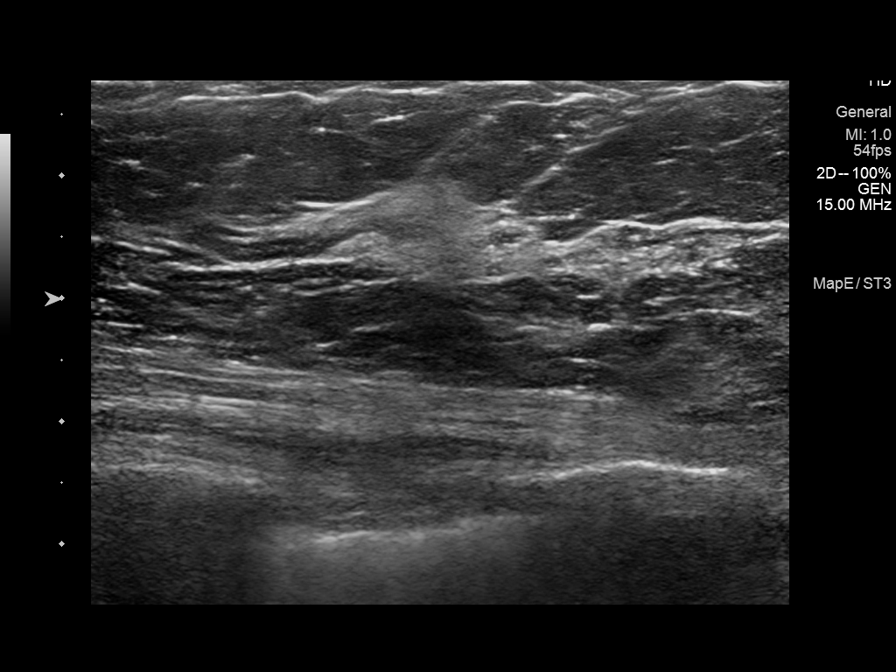

[8 of 8 positions shown; findings below may reference images not displayed]

ACR Breast Density Category c: The breast tissue is heterogeneously
dense, which may obscure small masses.
FINDINGS: 2D and 3D tomographic spot compression views of the left breast were
obtained. Normal appearing breast tissue is demonstrated at the
location of the recently suspected distortion in the 12 o'clock
retroareolar region. Slightly more laterally, there is a
questionable area of architectural distortion in the craniocaudal
projection, not seen in the oblique projection. There is no
distortion at this location on the recent 2D and 3D screening
craniocaudal views.

On physical exam, no mass is palpable in the retroareolar left
breast.

Targeted ultrasound is performed, showing normal appearing breast
tissue throughout the retroareolar regions of the left breast. This
includes dense glandular tissue with peaks and ridges and adjacent
supporting ligaments, corresponding to the inconsistent mammographic
findings suggesting architectural distortion. No true distortion is
seen and no mass is demonstrated.
IMPRESSION: No evidence of malignancy. The suspected architectural distortion in
the retroareolar left breast was normal glandular tissue and
supporting ligaments.

RECOMMENDATION:
Bilateral screening mammogram in 1 year.

I have discussed the findings and recommendations with the patient.
Results were also provided in writing at the conclusion of the
visit. If applicable, a reminder letter will be sent to the patient
regarding the next appointment.

BI-RADS CATEGORY  1: Negative.

## 2017-09-05 DIAGNOSIS — F419 Anxiety disorder, unspecified: Secondary | ICD-10-CM | POA: Diagnosis not present

## 2017-09-05 DIAGNOSIS — Z6827 Body mass index (BMI) 27.0-27.9, adult: Secondary | ICD-10-CM | POA: Diagnosis not present

## 2017-09-05 DIAGNOSIS — E663 Overweight: Secondary | ICD-10-CM | POA: Diagnosis not present

## 2017-09-05 DIAGNOSIS — M67864 Other specified disorders of tendon, left knee: Secondary | ICD-10-CM | POA: Diagnosis not present

## 2017-11-04 DIAGNOSIS — F329 Major depressive disorder, single episode, unspecified: Secondary | ICD-10-CM | POA: Diagnosis not present

## 2017-11-04 DIAGNOSIS — F419 Anxiety disorder, unspecified: Secondary | ICD-10-CM | POA: Diagnosis not present

## 2017-11-04 DIAGNOSIS — Z1389 Encounter for screening for other disorder: Secondary | ICD-10-CM | POA: Diagnosis not present

## 2017-11-04 DIAGNOSIS — G5601 Carpal tunnel syndrome, right upper limb: Secondary | ICD-10-CM | POA: Diagnosis not present

## 2017-11-04 DIAGNOSIS — Q8500 Neurofibromatosis, unspecified: Secondary | ICD-10-CM | POA: Diagnosis not present

## 2017-11-04 DIAGNOSIS — E663 Overweight: Secondary | ICD-10-CM | POA: Diagnosis not present

## 2017-11-04 DIAGNOSIS — Z6827 Body mass index (BMI) 27.0-27.9, adult: Secondary | ICD-10-CM | POA: Diagnosis not present

## 2018-02-10 DIAGNOSIS — E538 Deficiency of other specified B group vitamins: Secondary | ICD-10-CM | POA: Diagnosis not present

## 2018-02-10 DIAGNOSIS — F329 Major depressive disorder, single episode, unspecified: Secondary | ICD-10-CM | POA: Diagnosis not present

## 2018-02-10 DIAGNOSIS — F419 Anxiety disorder, unspecified: Secondary | ICD-10-CM | POA: Diagnosis not present

## 2018-02-10 DIAGNOSIS — Q8501 Neurofibromatosis, type 1: Secondary | ICD-10-CM | POA: Diagnosis not present

## 2018-02-10 DIAGNOSIS — I10 Essential (primary) hypertension: Secondary | ICD-10-CM | POA: Diagnosis not present

## 2018-02-10 DIAGNOSIS — E663 Overweight: Secondary | ICD-10-CM | POA: Diagnosis not present

## 2018-02-10 DIAGNOSIS — Z6828 Body mass index (BMI) 28.0-28.9, adult: Secondary | ICD-10-CM | POA: Diagnosis not present

## 2018-02-10 DIAGNOSIS — Z0001 Encounter for general adult medical examination with abnormal findings: Secondary | ICD-10-CM | POA: Diagnosis not present

## 2018-02-10 DIAGNOSIS — I8393 Asymptomatic varicose veins of bilateral lower extremities: Secondary | ICD-10-CM | POA: Diagnosis not present

## 2018-02-10 DIAGNOSIS — Z1389 Encounter for screening for other disorder: Secondary | ICD-10-CM | POA: Diagnosis not present

## 2018-02-13 ENCOUNTER — Other Ambulatory Visit (HOSPITAL_COMMUNITY): Payer: Self-pay | Admitting: Internal Medicine

## 2018-02-13 DIAGNOSIS — Z1231 Encounter for screening mammogram for malignant neoplasm of breast: Secondary | ICD-10-CM

## 2018-02-17 ENCOUNTER — Ambulatory Visit (HOSPITAL_COMMUNITY)
Admission: RE | Admit: 2018-02-17 | Discharge: 2018-02-17 | Disposition: A | Discharge status: Home / Self Care | Payer: BLUE CROSS/BLUE SHIELD | Source: Ambulatory Visit | Attending: Internal Medicine | Admitting: Internal Medicine

## 2018-02-17 ENCOUNTER — Encounter (HOSPITAL_COMMUNITY): Payer: Self-pay

## 2018-02-17 DIAGNOSIS — Z1231 Encounter for screening mammogram for malignant neoplasm of breast: Secondary | ICD-10-CM | POA: Insufficient documentation

## 2018-05-26 DIAGNOSIS — F419 Anxiety disorder, unspecified: Secondary | ICD-10-CM | POA: Diagnosis not present

## 2018-05-26 DIAGNOSIS — Z6829 Body mass index (BMI) 29.0-29.9, adult: Secondary | ICD-10-CM | POA: Diagnosis not present

## 2018-05-26 DIAGNOSIS — Z1389 Encounter for screening for other disorder: Secondary | ICD-10-CM | POA: Diagnosis not present

## 2018-05-26 DIAGNOSIS — Z23 Encounter for immunization: Secondary | ICD-10-CM | POA: Diagnosis not present

## 2018-05-26 DIAGNOSIS — E663 Overweight: Secondary | ICD-10-CM | POA: Diagnosis not present

## 2018-09-08 DIAGNOSIS — Z1389 Encounter for screening for other disorder: Secondary | ICD-10-CM | POA: Diagnosis not present

## 2018-09-08 DIAGNOSIS — M1990 Unspecified osteoarthritis, unspecified site: Secondary | ICD-10-CM | POA: Diagnosis not present

## 2018-09-08 DIAGNOSIS — Z6828 Body mass index (BMI) 28.0-28.9, adult: Secondary | ICD-10-CM | POA: Diagnosis not present

## 2018-09-08 DIAGNOSIS — M1991 Primary osteoarthritis, unspecified site: Secondary | ICD-10-CM | POA: Diagnosis not present

## 2018-09-08 DIAGNOSIS — I10 Essential (primary) hypertension: Secondary | ICD-10-CM | POA: Diagnosis not present

## 2019-01-08 DIAGNOSIS — F419 Anxiety disorder, unspecified: Secondary | ICD-10-CM | POA: Diagnosis not present

## 2019-01-08 DIAGNOSIS — Z1389 Encounter for screening for other disorder: Secondary | ICD-10-CM | POA: Diagnosis not present

## 2019-01-08 DIAGNOSIS — Z6830 Body mass index (BMI) 30.0-30.9, adult: Secondary | ICD-10-CM | POA: Diagnosis not present

## 2019-01-08 DIAGNOSIS — F329 Major depressive disorder, single episode, unspecified: Secondary | ICD-10-CM | POA: Diagnosis not present

## 2019-01-08 DIAGNOSIS — M1991 Primary osteoarthritis, unspecified site: Secondary | ICD-10-CM | POA: Diagnosis not present

## 2019-04-13 DIAGNOSIS — Z23 Encounter for immunization: Secondary | ICD-10-CM | POA: Diagnosis not present

## 2019-04-13 DIAGNOSIS — F329 Major depressive disorder, single episode, unspecified: Secondary | ICD-10-CM | POA: Diagnosis not present

## 2019-04-13 DIAGNOSIS — M1991 Primary osteoarthritis, unspecified site: Secondary | ICD-10-CM | POA: Diagnosis not present

## 2019-04-13 DIAGNOSIS — E663 Overweight: Secondary | ICD-10-CM | POA: Diagnosis not present

## 2019-04-13 DIAGNOSIS — I10 Essential (primary) hypertension: Secondary | ICD-10-CM | POA: Diagnosis not present

## 2019-04-13 DIAGNOSIS — Z6829 Body mass index (BMI) 29.0-29.9, adult: Secondary | ICD-10-CM | POA: Diagnosis not present

## 2019-04-13 DIAGNOSIS — Z0001 Encounter for general adult medical examination with abnormal findings: Secondary | ICD-10-CM | POA: Diagnosis not present

## 2019-04-13 DIAGNOSIS — F419 Anxiety disorder, unspecified: Secondary | ICD-10-CM | POA: Diagnosis not present

## 2019-04-16 ENCOUNTER — Other Ambulatory Visit (HOSPITAL_COMMUNITY): Payer: Self-pay | Admitting: Internal Medicine

## 2019-04-16 DIAGNOSIS — Z1231 Encounter for screening mammogram for malignant neoplasm of breast: Secondary | ICD-10-CM

## 2019-08-13 DIAGNOSIS — I10 Essential (primary) hypertension: Secondary | ICD-10-CM | POA: Diagnosis not present

## 2019-08-13 DIAGNOSIS — D3610 Benign neoplasm of peripheral nerves and autonomic nervous system, unspecified: Secondary | ICD-10-CM | POA: Diagnosis not present

## 2019-08-13 DIAGNOSIS — M1991 Primary osteoarthritis, unspecified site: Secondary | ICD-10-CM | POA: Diagnosis not present

## 2019-08-13 DIAGNOSIS — Z6829 Body mass index (BMI) 29.0-29.9, adult: Secondary | ICD-10-CM | POA: Diagnosis not present

## 2019-08-13 DIAGNOSIS — M79672 Pain in left foot: Secondary | ICD-10-CM | POA: Diagnosis not present

## 2019-11-09 DIAGNOSIS — D519 Vitamin B12 deficiency anemia, unspecified: Secondary | ICD-10-CM | POA: Diagnosis not present

## 2019-11-09 DIAGNOSIS — E663 Overweight: Secondary | ICD-10-CM | POA: Diagnosis not present

## 2019-11-09 DIAGNOSIS — Z6829 Body mass index (BMI) 29.0-29.9, adult: Secondary | ICD-10-CM | POA: Diagnosis not present

## 2019-11-09 DIAGNOSIS — E538 Deficiency of other specified B group vitamins: Secondary | ICD-10-CM | POA: Diagnosis not present

## 2019-11-09 DIAGNOSIS — F419 Anxiety disorder, unspecified: Secondary | ICD-10-CM | POA: Diagnosis not present

## 2020-04-11 ENCOUNTER — Other Ambulatory Visit (HOSPITAL_COMMUNITY): Payer: Self-pay | Admitting: Family Medicine

## 2020-04-11 DIAGNOSIS — Z1231 Encounter for screening mammogram for malignant neoplasm of breast: Secondary | ICD-10-CM

## 2020-04-11 DIAGNOSIS — Z6829 Body mass index (BMI) 29.0-29.9, adult: Secondary | ICD-10-CM | POA: Diagnosis not present

## 2020-04-11 DIAGNOSIS — F419 Anxiety disorder, unspecified: Secondary | ICD-10-CM | POA: Diagnosis not present

## 2020-04-11 DIAGNOSIS — E663 Overweight: Secondary | ICD-10-CM | POA: Diagnosis not present

## 2020-04-11 DIAGNOSIS — Z23 Encounter for immunization: Secondary | ICD-10-CM | POA: Diagnosis not present

## 2020-04-14 ENCOUNTER — Other Ambulatory Visit (HOSPITAL_COMMUNITY): Payer: Self-pay | Admitting: Internal Medicine

## 2020-04-14 DIAGNOSIS — Z1231 Encounter for screening mammogram for malignant neoplasm of breast: Secondary | ICD-10-CM

## 2020-04-28 ENCOUNTER — Ambulatory Visit (HOSPITAL_COMMUNITY)
Admission: RE | Admit: 2020-04-28 | Discharge: 2020-04-28 | Disposition: A | Discharge status: Home / Self Care | Payer: BC Managed Care – PPO | Source: Ambulatory Visit | Attending: Internal Medicine | Admitting: Internal Medicine

## 2020-04-28 ENCOUNTER — Other Ambulatory Visit: Payer: Self-pay

## 2020-04-28 DIAGNOSIS — Z1231 Encounter for screening mammogram for malignant neoplasm of breast: Secondary | ICD-10-CM | POA: Diagnosis not present

## 2020-07-07 DIAGNOSIS — M545 Low back pain, unspecified: Secondary | ICD-10-CM | POA: Diagnosis not present

## 2020-08-18 DIAGNOSIS — Z1389 Encounter for screening for other disorder: Secondary | ICD-10-CM | POA: Diagnosis not present

## 2020-08-18 DIAGNOSIS — E7849 Other hyperlipidemia: Secondary | ICD-10-CM | POA: Diagnosis not present

## 2020-08-18 DIAGNOSIS — Z6829 Body mass index (BMI) 29.0-29.9, adult: Secondary | ICD-10-CM | POA: Diagnosis not present

## 2020-08-18 DIAGNOSIS — Z0001 Encounter for general adult medical examination with abnormal findings: Secondary | ICD-10-CM | POA: Diagnosis not present

## 2020-08-18 DIAGNOSIS — Z1331 Encounter for screening for depression: Secondary | ICD-10-CM | POA: Diagnosis not present

## 2020-08-18 DIAGNOSIS — F419 Anxiety disorder, unspecified: Secondary | ICD-10-CM | POA: Diagnosis not present

## 2020-08-18 DIAGNOSIS — E663 Overweight: Secondary | ICD-10-CM | POA: Diagnosis not present

## 2021-01-20 DIAGNOSIS — E6609 Other obesity due to excess calories: Secondary | ICD-10-CM | POA: Diagnosis not present

## 2021-01-20 DIAGNOSIS — Z6831 Body mass index (BMI) 31.0-31.9, adult: Secondary | ICD-10-CM | POA: Diagnosis not present

## 2021-01-20 DIAGNOSIS — I10 Essential (primary) hypertension: Secondary | ICD-10-CM | POA: Diagnosis not present

## 2021-01-20 DIAGNOSIS — F419 Anxiety disorder, unspecified: Secondary | ICD-10-CM | POA: Diagnosis not present

## 2021-03-18 ENCOUNTER — Encounter: Payer: Self-pay | Admitting: *Deleted

## 2021-04-17 DIAGNOSIS — M1990 Unspecified osteoarthritis, unspecified site: Secondary | ICD-10-CM | POA: Diagnosis not present

## 2021-04-17 DIAGNOSIS — Z23 Encounter for immunization: Secondary | ICD-10-CM | POA: Diagnosis not present

## 2021-06-08 ENCOUNTER — Other Ambulatory Visit: Payer: Self-pay

## 2021-06-08 ENCOUNTER — Ambulatory Visit: Payer: BC Managed Care – PPO | Admitting: Orthopedic Surgery

## 2021-06-08 ENCOUNTER — Encounter: Payer: Self-pay | Admitting: Orthopedic Surgery

## 2021-06-08 ENCOUNTER — Ambulatory Visit: Payer: BC Managed Care – PPO

## 2021-06-08 VITALS — Ht 62.0 in | Wt 176.0 lb

## 2021-06-08 DIAGNOSIS — G8929 Other chronic pain: Secondary | ICD-10-CM

## 2021-06-08 DIAGNOSIS — M545 Low back pain, unspecified: Secondary | ICD-10-CM

## 2021-06-08 DIAGNOSIS — M170 Bilateral primary osteoarthritis of knee: Secondary | ICD-10-CM

## 2021-06-08 MED ORDER — MELOXICAM 7.5 MG PO TABS: 7.5000 mg | ORAL_TABLET | Freq: Every day | ORAL | 5 refills | Status: DC

## 2021-06-08 MED ORDER — METHOCARBAMOL 750 MG PO TABS: 750.0000 mg | ORAL_TABLET | Freq: Four times a day (QID) | ORAL | 2 refills | Status: DC

## 2021-06-08 MED ORDER — METHYLPREDNISOLONE ACETATE 40 MG/ML IJ SUSP
40.0000 mg | Freq: Once | INTRAMUSCULAR | Status: AC
  Administered 2021-06-08: 40 mg via INTRAMUSCULAR

## 2021-06-08 NOTE — Patient Instructions (Signed)
Heat pad on the back   Take the muscle relaxer at night

## 2021-06-08 NOTE — Progress Notes (Signed)
EVALUATION AND MANAGEMENT   Type of appointment : New patient  PLAN:  IM injection left hip for back pain  Robaxin for back pain  Meloxicam for knee arthritis  Follow-up as needed  Meds ordered this encounter  Medications   methocarbamol (ROBAXIN) 750 MG tablet    Sig: Take 1 tablet (750 mg total) by mouth 4 (four) times daily.    Dispense:  60 tablet    Refill:  2   meloxicam (MOBIC) 7.5 MG tablet    Sig: Take 1 tablet (7.5 mg total) by mouth daily.    Dispense:  30 tablet    Refill:  5     Chief Complaint  Patient presents with   Knee Pain    Bilateral knee pain   Hip Pain    RT hip with sciatica     Cassandra Davidson has come to [place] today for evaluation of bilateral knee pain however on Saturday she was getting the [place] out of the refrigerator and developed acute onset of lower back pain  This is not associated with any leg symptoms or bowel or bladder dysfunction  She also complains of right knee pain she says she walks 5 to 10 miles a day at [employer].  Her right knee is very stiff and it is hard to get going when she gets out of a seated position if she has been sitting for long time.  She feels like the knee may lock and it is popping.  The pain is nonspecific in terms of medial or lateral pain and she describes the whole knee is hurting  As far as the left knee goes about 2 years ago she was going sideways down the steps and felt like she may have twisted the knee  Dr. Riley Kill gave her an injection in her hip and it seemed to give her good relief of her knee pain for quite a while     ROS She does complain of fatigue easy bruising and bleeding some depression some muscle aches and occasional urgency the other systems were reviewed and were normal  Body mass index is 32.19 kg/m.  Physical Exam  Ht 5\' 2"  (1.575 m)   Wt 176 lb (79.8 kg)   BMI 32.19 kg/m   General appearance.  She is normally developed normally groomed no gross deformities  Neurological exam is normal  in terms of sensation and no pathologic reflexes are noted.  Psychiatric awake alert and oriented x3 mood and affect normal  Skin normal both knees  Cardiovascular no distal edema  Gait and station appear to be normal  She is tender across that lower back there is no evidence of positive findings on straight leg raise  On the right knee to be fine full range of motion and good stability and strength normal muscle tone with tenderness diffusely across the medial and lateral compartments  On the left knee full range of motion again is noted the ligaments are stable to joints are reduced muscle tone and strength are normal and there is no effusion  Past Medical History:  Diagnosis Date   Anxiety    B12 deficiency    Depression    Depression    Hypertension    Neurofibromatosis    Obesity    Von Recklinghausen's disease ([place])    Past Surgical History:  Procedure Laterality Date   ABDOMINAL HYSTERECTOMY     CHOLECYSTECTOMY  1997   ELBOW SURGERY     PARTIAL HYSTERECTOMY  POLYPECTOMY  05/05/2011   [id number] Hemorrhoids/sessile polyps   SALPINGOOPHORECTOMY     right   THYROID SURGERY     nodule   TUBAL LIGATION     Social History   Tobacco Use   Smoking status: Every Day    Packs/day: 1.00    Years: 30.00    Pack years: 30.00    Types: Cigarettes    Last attempt to quit: 12/12/2009    Years since quitting: 11.4   Smokeless tobacco: Former    Quit date: 12/12/2009  Substance Use Topics   Alcohol use: No    Comment: socially(very little) couple times/yr   Drug use: No     Assessment and Plan:  Encounter Diagnoses  Name Primary?   Chronic pain of right knee    Chronic pain of left knee    Arthritis of both knees    Acute midline low back pain without sciatica Yes    The x-rays show very good joint space on both knees in the medial lateral compartments and normal patellofemoral joint.  She does have some subtle mild changes of arthritis with peaking of  the tibial spines and some small bone spurs around the joint  Recommend oral anti-inflammatory medication  Injection of the left hip IM for the back pain along with the muscle relaxer  Meds ordered this encounter  Medications   methocarbamol (ROBAXIN) 750 MG tablet    Sig: Take 1 tablet (750 mg total) by mouth 4 (four) times daily.    Dispense:  60 tablet    Refill:  2   meloxicam (MOBIC) 7.5 MG tablet    Sig: Take 1 tablet (7.5 mg total) by mouth daily.    Dispense:  30 tablet    Refill:  5   methylPREDNISolone acetate (DEPO-MEDROL) injection 40 mg    I asked her if she could stay out of work longer and she says she could not because if she does not work Tuesday she will not be paid for the holiday  She will use a heating pad on her back and hopefully the muscle relaxer and the meloxicam along with the injection will help  We also expect some pain relief from her arthritis from the meloxicam  The nurses given the IM injection

## 2021-08-17 ENCOUNTER — Other Ambulatory Visit: Payer: Self-pay | Admitting: Orthopedic Surgery

## 2021-08-17 DIAGNOSIS — M545 Low back pain, unspecified: Secondary | ICD-10-CM

## 2021-08-24 DIAGNOSIS — J40 Bronchitis, not specified as acute or chronic: Secondary | ICD-10-CM | POA: Diagnosis not present

## 2021-10-15 IMAGING — MG DIGITAL SCREENING BILAT W/ TOMO W/ CAD
8 series · 9 of 24 positions shown · non-contrast
Comparison: Previous exam(s).

CLINICAL DATA: Screening.

EXAM:
DIGITAL SCREENING BILATERAL MAMMOGRAM WITH TOMO AND CAD

[R CC synth-2D]
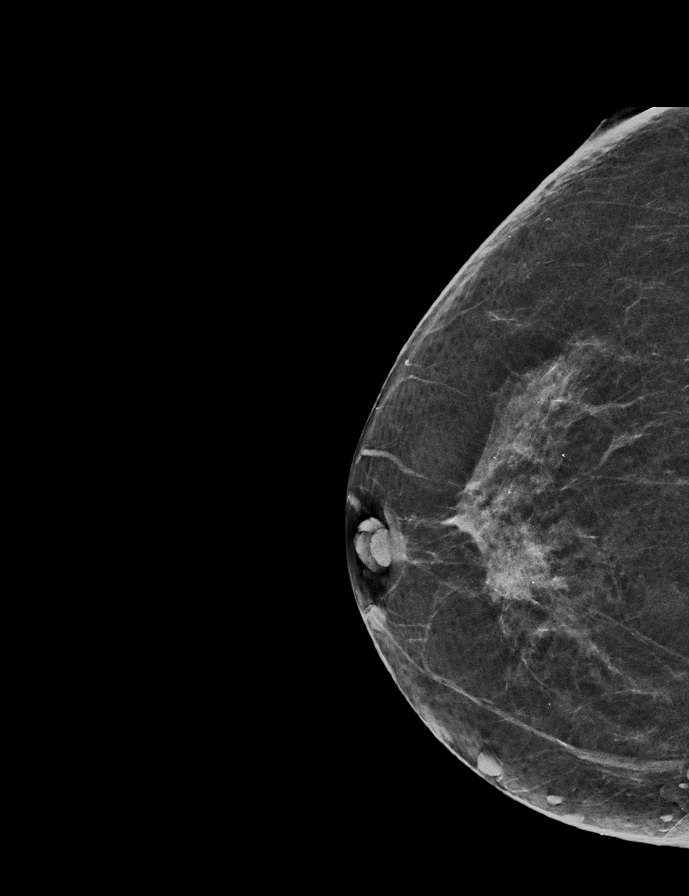

[L CC synth-2D]
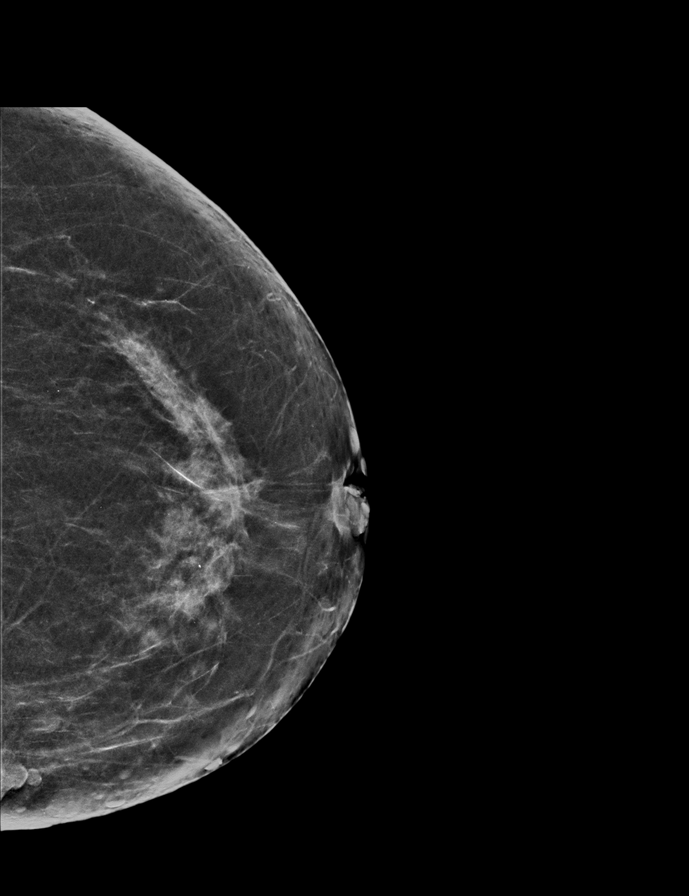

[R MLO synth-2D]
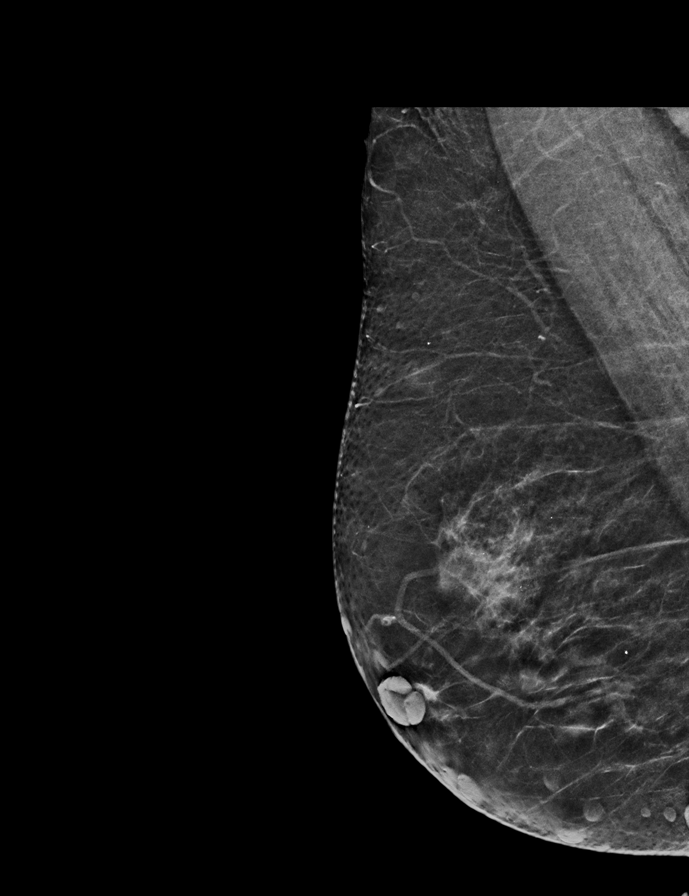

[L MLO synth-2D]
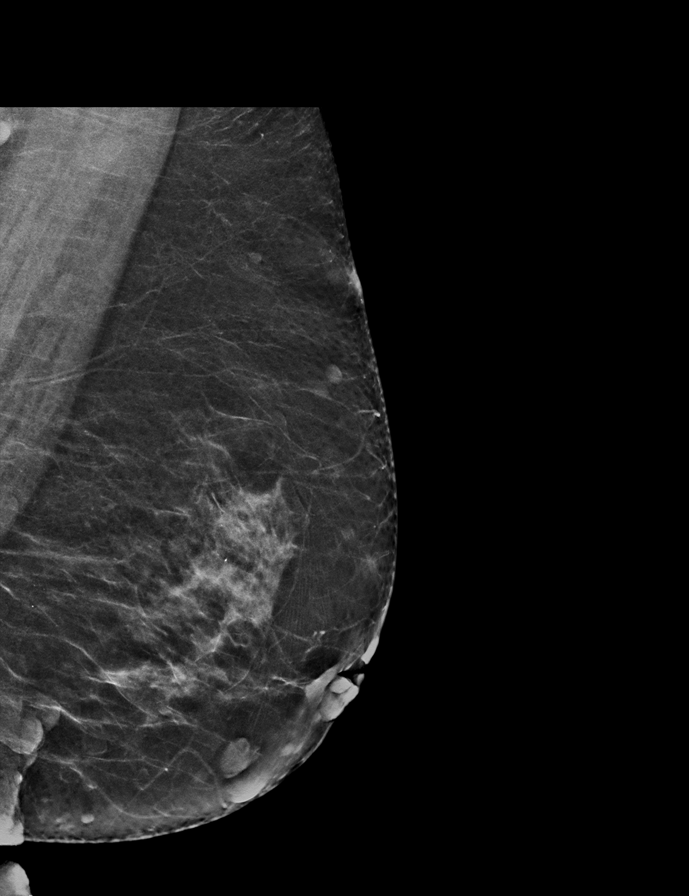

[L MLO tomo · 2 of 61 frames shown]
[frame 20/61]
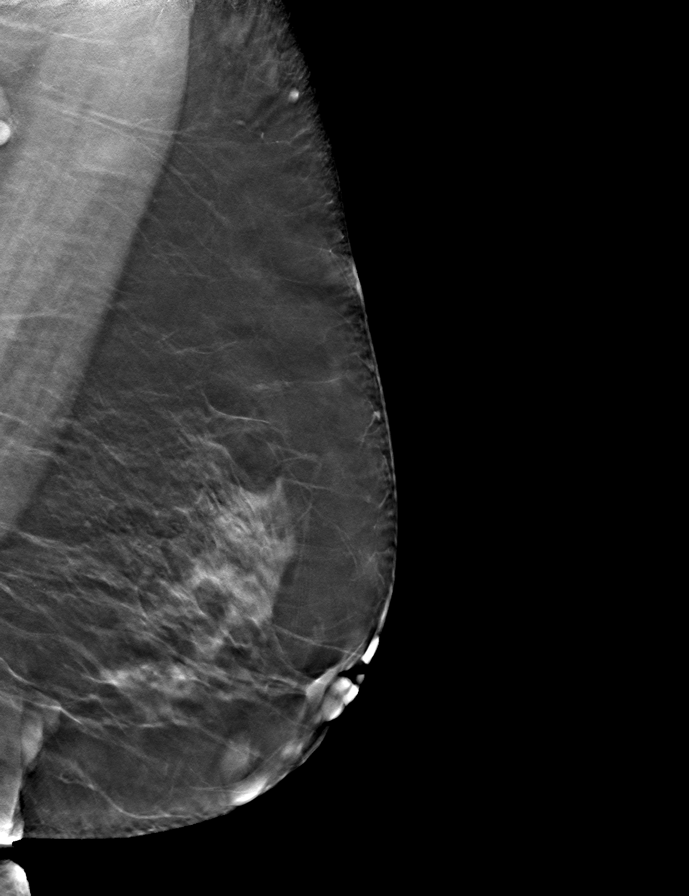
[frame 31/61]
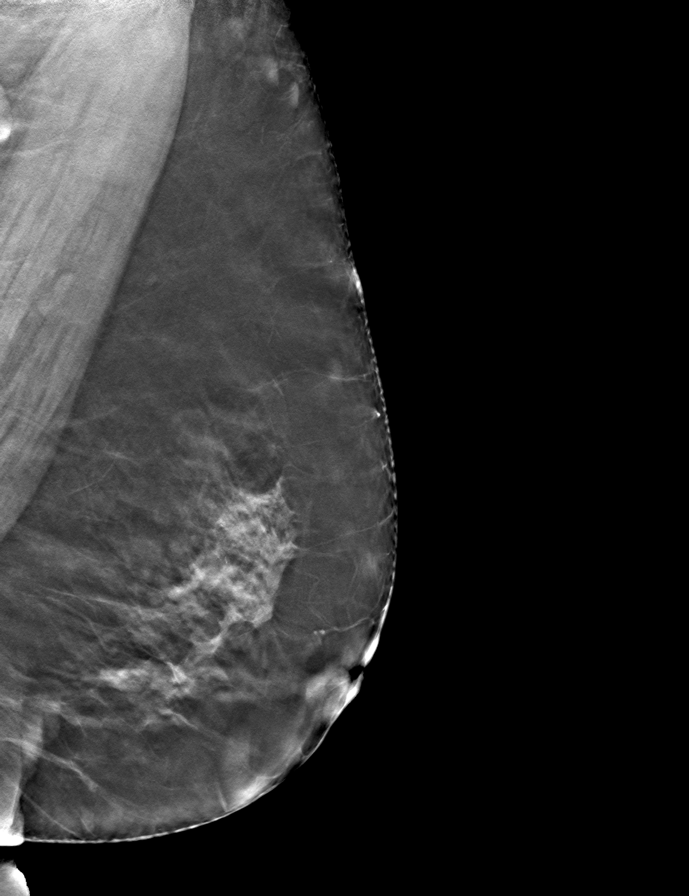

[R MLO tomo · tomo slice 27/53.0]
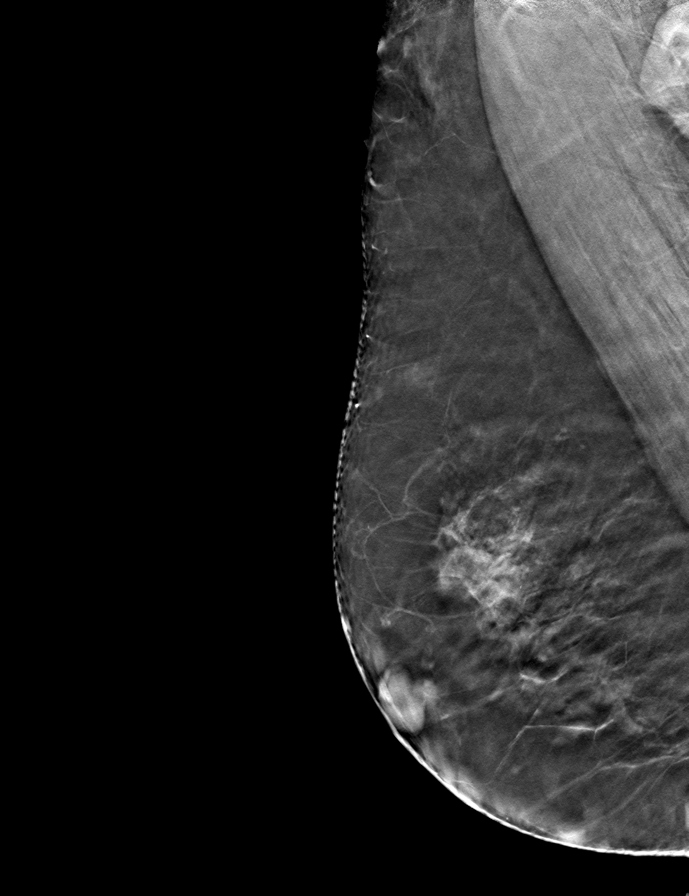

[R CC tomo · tomo slice 26/51.0]
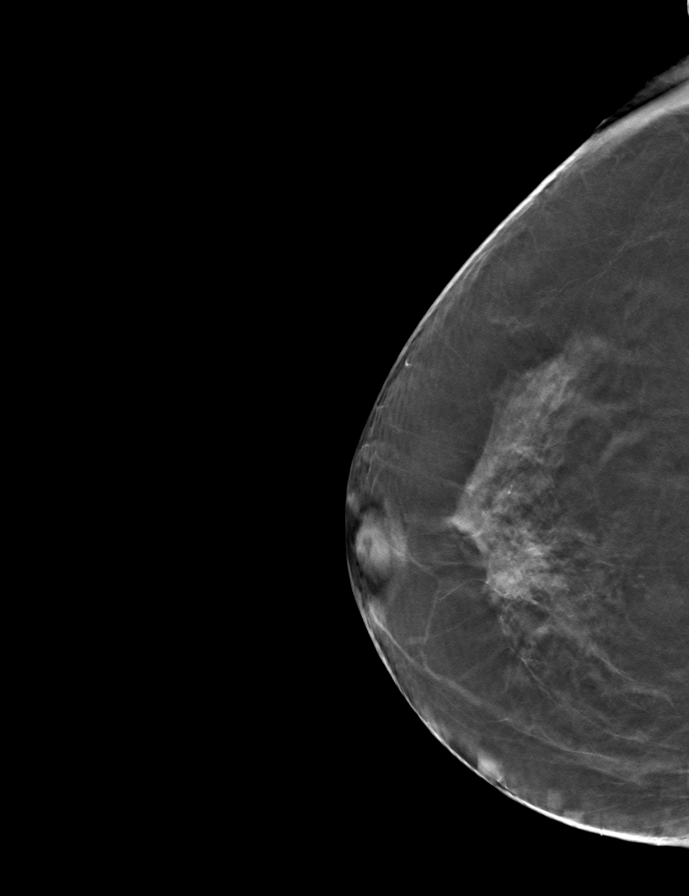

[L CC tomo · tomo slice 30/59.0]
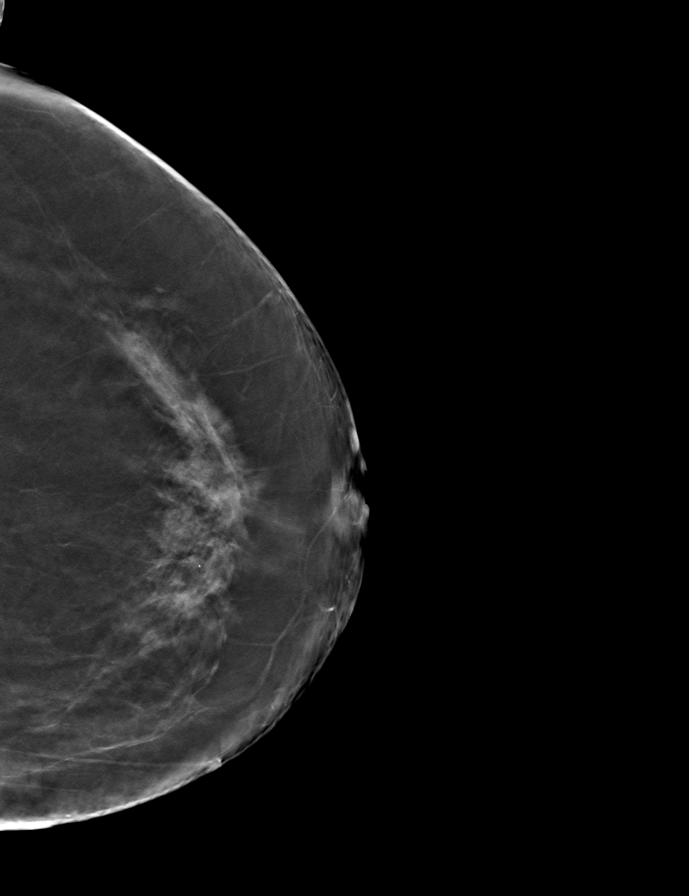

[9 of 24 positions shown; findings below may reference images not displayed]

ACR Breast Density Category c: The breast tissue is heterogeneously
dense, which may obscure small masses.
FINDINGS: There are no findings suspicious for malignancy. Images were
processed with CAD.
IMPRESSION: No mammographic evidence of malignancy. A result letter of this
screening mammogram will be mailed directly to the patient.

RECOMMENDATION:
Screening mammogram in one year. (Code:[id number])

BI-RADS CATEGORY  1: Negative.

## 2021-11-25 DIAGNOSIS — M1990 Unspecified osteoarthritis, unspecified site: Secondary | ICD-10-CM | POA: Diagnosis not present

## 2021-11-25 DIAGNOSIS — F321 Major depressive disorder, single episode, moderate: Secondary | ICD-10-CM | POA: Diagnosis not present

## 2021-11-25 DIAGNOSIS — F419 Anxiety disorder, unspecified: Secondary | ICD-10-CM | POA: Diagnosis not present

## 2021-11-25 DIAGNOSIS — Q8500 Neurofibromatosis, unspecified: Secondary | ICD-10-CM | POA: Diagnosis not present

## 2022-01-28 ENCOUNTER — Ambulatory Visit (INDEPENDENT_AMBULATORY_CARE_PROVIDER_SITE_OTHER): Payer: BC Managed Care – PPO | Admitting: Orthopedic Surgery

## 2022-01-28 DIAGNOSIS — G8929 Other chronic pain: Secondary | ICD-10-CM

## 2022-01-28 DIAGNOSIS — M25561 Pain in right knee: Secondary | ICD-10-CM

## 2022-01-28 DIAGNOSIS — M25562 Pain in left knee: Secondary | ICD-10-CM

## 2022-01-28 NOTE — Patient Instructions (Signed)

## 2022-01-28 NOTE — Progress Notes (Signed)
Chief Complaint  Patient presents with   Knee Pain    Bil/R knee is the worst. Here for injections.    Encounter Diagnoses  Name Primary?   Chronic pain of left knee Yes   Chronic pain of right knee     Procedure note for knee injection    Procedure note right knee injection verbal consent was obtained to inject right knee joint  Timeout was completed to confirm the site of injection  The medications used were 40 mg depomedrol and 3 cc of 1% lidocaine  Anesthesia was provided by ethyl chloride and the skin was prepped with alcohol.  After cleaning the skin with alcohol a 20-gauge needle was used to inject the right knee joint. There were no complications. A sterile bandage was applied.   RET AS NEEDED

## 2022-02-02 ENCOUNTER — Other Ambulatory Visit: Payer: Self-pay | Admitting: Orthopedic Surgery

## 2022-02-02 DIAGNOSIS — M170 Bilateral primary osteoarthritis of knee: Secondary | ICD-10-CM

## 2022-03-15 DIAGNOSIS — I10 Essential (primary) hypertension: Secondary | ICD-10-CM | POA: Diagnosis not present

## 2022-03-15 DIAGNOSIS — Q8500 Neurofibromatosis, unspecified: Secondary | ICD-10-CM | POA: Diagnosis not present

## 2022-03-15 DIAGNOSIS — M1991 Primary osteoarthritis, unspecified site: Secondary | ICD-10-CM | POA: Diagnosis not present

## 2022-03-15 DIAGNOSIS — F419 Anxiety disorder, unspecified: Secondary | ICD-10-CM | POA: Diagnosis not present

## 2022-05-06 DIAGNOSIS — Z23 Encounter for immunization: Secondary | ICD-10-CM | POA: Diagnosis not present

## 2022-07-08 ENCOUNTER — Encounter: Payer: Self-pay | Admitting: Orthopedic Surgery

## 2022-07-08 ENCOUNTER — Ambulatory Visit (INDEPENDENT_AMBULATORY_CARE_PROVIDER_SITE_OTHER): Payer: BC Managed Care – PPO | Admitting: Orthopedic Surgery

## 2022-07-08 VITALS — Ht 62.0 in | Wt 176.0 lb

## 2022-07-08 DIAGNOSIS — M25561 Pain in right knee: Secondary | ICD-10-CM | POA: Diagnosis not present

## 2022-07-08 DIAGNOSIS — G8929 Other chronic pain: Secondary | ICD-10-CM | POA: Diagnosis not present

## 2022-07-08 DIAGNOSIS — M1711 Unilateral primary osteoarthritis, right knee: Secondary | ICD-10-CM

## 2022-07-08 MED ORDER — METHYLPREDNISOLONE ACETATE 40 MG/ML IJ SUSP
40.0000 mg | Freq: Once | INTRAMUSCULAR | Status: AC
  Administered 2022-07-08: 40 mg via INTRA_ARTICULAR

## 2022-07-08 NOTE — Patient Instructions (Signed)

## 2022-07-08 NOTE — Progress Notes (Signed)
The patient has requested an injection  Complaint  Chief Complaint  Patient presents with   Knee Pain    Rt knee wants injection      Diagnosis  Encounter Diagnoses  Name Primary?   Chronic pain of right knee Yes   Primary osteoarthritis of right knee      After appropriate timeout for site confirmation medication confirmation,  The right knee  was prepped with alcohol and ethyl chloride spray.  The injection was performed at the right knee lateral joint line   Medication Depomedrol 40 mg and 1% lidocaine plain   There were no complications  The patient was observed for any reactions there were none and the patient was discharged.

## 2022-08-23 DIAGNOSIS — E6609 Other obesity due to excess calories: Secondary | ICD-10-CM | POA: Diagnosis not present

## 2022-08-23 DIAGNOSIS — D519 Vitamin B12 deficiency anemia, unspecified: Secondary | ICD-10-CM | POA: Diagnosis not present

## 2022-08-23 DIAGNOSIS — Q8500 Neurofibromatosis, unspecified: Secondary | ICD-10-CM | POA: Diagnosis not present

## 2022-08-23 DIAGNOSIS — M1990 Unspecified osteoarthritis, unspecified site: Secondary | ICD-10-CM | POA: Diagnosis not present

## 2022-08-23 DIAGNOSIS — I10 Essential (primary) hypertension: Secondary | ICD-10-CM | POA: Diagnosis not present

## 2022-08-23 DIAGNOSIS — Z6835 Body mass index (BMI) 35.0-35.9, adult: Secondary | ICD-10-CM | POA: Diagnosis not present

## 2022-08-23 DIAGNOSIS — J329 Chronic sinusitis, unspecified: Secondary | ICD-10-CM | POA: Diagnosis not present

## 2022-08-23 DIAGNOSIS — F419 Anxiety disorder, unspecified: Secondary | ICD-10-CM | POA: Diagnosis not present

## 2022-08-23 DIAGNOSIS — M1991 Primary osteoarthritis, unspecified site: Secondary | ICD-10-CM | POA: Diagnosis not present

## 2022-09-13 ENCOUNTER — Other Ambulatory Visit: Payer: Self-pay | Admitting: Orthopedic Surgery

## 2022-09-13 DIAGNOSIS — M170 Bilateral primary osteoarthritis of knee: Secondary | ICD-10-CM

## 2022-09-16 ENCOUNTER — Encounter: Payer: Self-pay | Admitting: Radiology

## 2022-09-27 ENCOUNTER — Telehealth: Payer: Self-pay | Admitting: Orthopedic Surgery

## 2022-09-27 DIAGNOSIS — M25561 Pain in right knee: Secondary | ICD-10-CM | POA: Diagnosis not present

## 2022-09-27 DIAGNOSIS — L03115 Cellulitis of right lower limb: Secondary | ICD-10-CM | POA: Diagnosis not present

## 2022-09-27 DIAGNOSIS — S8001XA Contusion of right knee, initial encounter: Secondary | ICD-10-CM | POA: Diagnosis not present

## 2022-09-27 DIAGNOSIS — S8991XA Unspecified injury of right lower leg, initial encounter: Secondary | ICD-10-CM | POA: Diagnosis not present

## 2022-09-27 DIAGNOSIS — M25461 Effusion, right knee: Secondary | ICD-10-CM | POA: Diagnosis not present

## 2022-09-27 NOTE — Telephone Encounter (Signed)
Returned the patient's call, lvm for her to cb to schedule.

## 2022-10-14 ENCOUNTER — Ambulatory Visit: Payer: BC Managed Care – PPO | Admitting: Orthopedic Surgery

## 2022-10-14 ENCOUNTER — Encounter: Payer: Self-pay | Admitting: Orthopedic Surgery

## 2022-10-14 DIAGNOSIS — S8001XA Contusion of right knee, initial encounter: Secondary | ICD-10-CM

## 2022-10-14 DIAGNOSIS — M25561 Pain in right knee: Secondary | ICD-10-CM | POA: Diagnosis not present

## 2022-10-14 MED ORDER — MELOXICAM 7.5 MG PO TABS: 7.5000 mg | ORAL_TABLET | Freq: Every day | ORAL | 5 refills | Status: DC

## 2022-10-14 NOTE — Progress Notes (Signed)
Office Visit Note   Patient: Cassandra Davidson           Date of Birth: 1961/02/17           MRN: [id number] Visit Date: 10/14/2022 Requested by: Redmond School, MD [address] [place],   [id number] PCP: Redmond School, MD  Subjective: Chief Complaint  Patient presents with   Knee Pain    Right knee/ went to urgent care 09/27/22 after a fall / ran out of Meloxicam needs refill     HPI: 62 year old female fell onto a flexed knee on March 11 went to urgent care was thought to have a cellulitis put on Keflex was also thought to have a knee contusion placed on ibuprofen still complains of pain and swelling over the front of the right knee  She is also a patient of ours and would like her meloxicam refilled              ROS: Negative  Assessment & Plan: Visit Diagnoses:  1. Contusion of right knee, initial encounter   2. Acute pain of right knee     Plan: Recommend rest activity modification, refill meloxicam, patient may return to work April 15 and follow-up with me on April 11  Follow-Up Instructions: Return in about 2 weeks (around 10/28/2022) for FOLLOW UP, RIGHT, KNEE, OOW NOTE 3/11 to 4/15.   Orders:  No orders of the defined types were placed in this encounter.  Meds ordered this encounter  Medications   meloxicam (MOBIC) 7.5 MG tablet    Sig: Take 1 tablet (7.5 mg total) by mouth daily.    Dispense:  30 tablet    Refill:  5    This prescription was filled on 08/24/2022. Any refills authorized will be placed on file.      Procedures: No procedures performed   Clinical Data: No additional findings.  Outside imaging disc noted x-ray taken September 27, 2022 no fracture dislocation or malalignment mild grade 1 degenerative changes  Objective: Vital Signs: Vital signs taken  Physical Exam: The patient is awake alert and oriented x 3  Mood and affect are normal.  Patient is ambulatory with a slight limp neurovascular exam is intact  Ortho Exam: Right knee  exam for comparison.  Full range of motion noted both knees both knees have a slight positive anterior drawer with firm endpoint tenderness over the right distal patellar pole and and tendon with normal quadriceps area to palpation no effusion twisting the knee reproduces no meniscal symptoms  Specialty Comments:  No specialty comments available.  Imaging: No results found.   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[place])     Family History  Problem Relation Age of Onset   Heart failure Mother    Uterine cancer Mother    Leukemia Father    Ulcers Father    Hypertension Father    Breast cancer Sister     Past Surgical History:  Procedure Laterality Date   ABDOMINAL HYSTERECTOMY     CHOLECYSTECTOMY  1997   ELBOW SURGERY     PARTIAL HYSTERECTOMY     POLYPECTOMY  05/05/2011   [id number] Hemorrhoids/sessile  polyps   SALPINGOOPHORECTOMY     right   THYROID SURGERY     nodule   TUBAL LIGATION     Social History   Occupational History   Occupation: forklift [employer]  Tobacco Use   Smoking status: Every Day    Packs/day: 1.00    Years: 30.00    Additional pack years: 0.00    Total pack years: 30.00    Types: Cigarettes    Last attempt to quit: 12/12/2009    Years since quitting: 12.8   Smokeless tobacco: Former    Quit date: 12/12/2009  Substance and Sexual Activity   Alcohol use: No    Comment: socially(very little) couple times/yr   Drug use: No   Sexual activity: Not on file    Meds ordered this encounter  Medications   meloxicam (MOBIC) 7.5 MG tablet    Sig: Take 1 tablet (7.5 mg total) by mouth daily.    Dispense:  30 tablet    Refill:  5    This prescription was filled on  08/24/2022. Any refills authorized will be placed on file.

## 2022-10-18 ENCOUNTER — Telehealth: Payer: Self-pay | Admitting: Orthopedic Surgery

## 2022-10-18 NOTE — Telephone Encounter (Signed)
[place] STD forms received. To [employer].

## 2022-10-27 DIAGNOSIS — H2513 Age-related nuclear cataract, bilateral: Secondary | ICD-10-CM | POA: Diagnosis not present

## 2022-10-28 ENCOUNTER — Ambulatory Visit: Payer: BC Managed Care – PPO | Admitting: Orthopedic Surgery

## 2022-10-28 ENCOUNTER — Encounter: Payer: Self-pay | Admitting: Orthopedic Surgery

## 2022-10-28 DIAGNOSIS — M25561 Pain in right knee: Secondary | ICD-10-CM

## 2022-10-28 DIAGNOSIS — M1711 Unilateral primary osteoarthritis, right knee: Secondary | ICD-10-CM

## 2022-10-28 DIAGNOSIS — G8929 Other chronic pain: Secondary | ICD-10-CM

## 2022-10-28 MED ORDER — METHYLPREDNISOLONE ACETATE 40 MG/ML IJ SUSP
40.0000 mg | Freq: Once | INTRAMUSCULAR | Status: AC
  Administered 2022-10-28: 40 mg via INTRA_ARTICULAR

## 2022-10-28 NOTE — Addendum Note (Signed)
Addended byCaffie Damme on: 10/28/2022 03:36 PM   Modules accepted: Orders

## 2022-10-28 NOTE — Progress Notes (Signed)
Chief Complaint  Patient presents with   Knee Pain    Right wants injection today     History of contusion right knee secondary to a fall also history of osteoarthritis right knee  While the contusion seems to have improved the chronic right knee pain is causing her quite a bit of a problem so were going to try an injection  Right knee injection  Procedure note right knee injection   verbal consent was obtained to inject right knee joint  Timeout was completed to confirm the site of injection  The medications used were depomedrol 40 mg and 1% lidocaine 3 cc Anesthesia was provided by ethyl chloride and the skin was prepped with alcohol.  After cleaning the skin with alcohol a 20-gauge needle was used to inject the right knee joint. There were no complications. A sterile bandage was applied.

## 2022-10-28 NOTE — Patient Instructions (Signed)
Please fax the return to work note to her number of choice

## 2022-11-29 NOTE — H&P (Signed)
Surgical History & Physical  Patient Name: Cassandra Davidson  DOB: 21-Nov-1960  Surgery: Cataract extraction with intraocular lens implant phacoemulsification; Right Eye Surgeon: Pecolia Ades MD Surgery Date: 12/10/2022 Pre-Op Date: 10/27/2022  HPI: A 20 Yr. old female patient present for cataract eval per Dr. Daphine Deutscher. 1. The patient complains of difficulty when reading fine print, cross stitching, crochet. Even at work she has to see small objects that are harder to see. Glare problems when driving. Both eyes are affected, OD>OS. The episode is constant. The condition's severity is worsening. This is negatively affecting the patient's quality of life and the patient is unable to function adequately in life with the current level of vision. Patient has had gritty feeling off and on for about 3 years. Denies using eye drops.  Medical History: Cataracts Arthritis High Blood Pressure Stroke anxiety, depression, neurofibromatosis: Von Recklinghausen's disease  Review of Systems Psychiatry Anxiety, Depression All recorded systems are negative except as noted above.  Social Current every day smoker of Vape   Medication Alprazolam, Bupropion HCL, Escitalopram, Atorvastatin, Aripiprazole  Sx/Procedures Hysterectomy, Tubal Ligation, Gallbladder Sx, Thyroid Removal  Drug Allergies  Codeine  History & Physical: Heent: cataracts NECK: supple without bruits LUNGS: lungs clear to auscultation CV: regular rate and rhythm Abdomen: soft and non-tender  Impression & Plan: Assessment: 1.  CATARACT AGE-RELATED NUCLEAR; Both Eyes (H25.13) 2.  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  Hyperopia ; Both Eyes (H52.03)  Plan: 1.  Cataracts are visually significant and account for the patient's complaints. Discussed all risks, benefits, procedures and recovery, including infection, loss of vision and eye, need for glasses after surgery or additional procedures. Patient understands changing glasses  will not improve vision. Patient indicated understanding of procedure. All questions answered. Patient desires to have surgery, recommend phacoemulsification with intraocular lens. Patient to have preliminary testing necessary (Argos/IOL Master, Mac OCT, TOPO) Educational materials provided: Cataract. RECOMMEND STANDARD  Plan: - proceed with cataract surgery OD, followed by OS - best distance target OU, RayOne Lenses OU - ok with lying flat, no prior eye surgeries - NF1, no apparent phacodonesis  2.  Lisch nodules, many neurofibromas, no known skeletal dysplasia Monitor  3.  continue with current for now

## 2022-12-03 ENCOUNTER — Encounter (HOSPITAL_COMMUNITY)
Admission: RE | Admit: 2022-12-03 | Discharge: 2022-12-03 | Disposition: A | Discharge status: Home / Self Care | Payer: BC Managed Care – PPO | Source: Ambulatory Visit | Attending: Optometry | Admitting: Optometry

## 2022-12-03 ENCOUNTER — Encounter (HOSPITAL_COMMUNITY): Payer: Self-pay

## 2022-12-03 DIAGNOSIS — H2511 Age-related nuclear cataract, right eye: Secondary | ICD-10-CM | POA: Diagnosis not present

## 2022-12-03 HISTORY — DX: Hypothyroidism, unspecified: E03.9

## 2022-12-10 ENCOUNTER — Encounter (HOSPITAL_COMMUNITY)
Admission: RE | Disposition: A | Discharge status: Home / Self Care | Payer: Self-pay | Source: Home / Self Care | Attending: Optometry

## 2022-12-10 ENCOUNTER — Ambulatory Visit (HOSPITAL_COMMUNITY): Payer: BC Managed Care – PPO | Admitting: Anesthesiology

## 2022-12-10 ENCOUNTER — Encounter (HOSPITAL_COMMUNITY): Payer: Self-pay | Admitting: Optometry

## 2022-12-10 ENCOUNTER — Ambulatory Visit (HOSPITAL_COMMUNITY)
Admission: RE | Admit: 2022-12-10 | Discharge: 2022-12-10 | Disposition: A | Discharge status: Home / Self Care | Payer: BC Managed Care – PPO | Attending: Optometry | Admitting: Optometry

## 2022-12-10 DIAGNOSIS — F32A Depression, unspecified: Secondary | ICD-10-CM | POA: Diagnosis not present

## 2022-12-10 DIAGNOSIS — H5203 Hypermetropia, bilateral: Secondary | ICD-10-CM | POA: Insufficient documentation

## 2022-12-10 DIAGNOSIS — E039 Hypothyroidism, unspecified: Secondary | ICD-10-CM | POA: Diagnosis not present

## 2022-12-10 DIAGNOSIS — H2511 Age-related nuclear cataract, right eye: Secondary | ICD-10-CM | POA: Diagnosis not present

## 2022-12-10 DIAGNOSIS — Q8501 Neurofibromatosis, type 1: Secondary | ICD-10-CM | POA: Diagnosis not present

## 2022-12-10 DIAGNOSIS — F419 Anxiety disorder, unspecified: Secondary | ICD-10-CM | POA: Insufficient documentation

## 2022-12-10 DIAGNOSIS — F17290 Nicotine dependence, other tobacco product, uncomplicated: Secondary | ICD-10-CM | POA: Diagnosis not present

## 2022-12-10 DIAGNOSIS — I10 Essential (primary) hypertension: Secondary | ICD-10-CM | POA: Insufficient documentation

## 2022-12-10 HISTORY — PX: CATARACT EXTRACTION W/PHACO: SHX586

## 2022-12-10 SURGERY — PHACOEMULSIFICATION, CATARACT, WITH IOL INSERTION
Anesthesia: Monitor Anesthesia Care | Site: Eye | Laterality: Right

## 2022-12-10 MED ORDER — BSS IO SOLN
INTRAOCULAR | Status: DC | PRN
  Administered 2022-12-10: 15 mL via INTRAOCULAR

## 2022-12-10 MED ORDER — MIDAZOLAM HCL 2 MG/2ML IJ SOLN
INTRAMUSCULAR | Status: DC | PRN
  Administered 2022-12-10: 2 mg via INTRAVENOUS

## 2022-12-10 MED ORDER — NEOMYCIN-POLYMYXIN-DEXAMETH 3.5-10000-0.1 OP SUSP
OPHTHALMIC | Status: DC | PRN
  Administered 2022-12-10: 2 [drp] via OPHTHALMIC

## 2022-12-10 MED ORDER — LIDOCAINE HCL 3.5 % OP GEL
1.0000 | Freq: Once | OPHTHALMIC | Status: AC
  Administered 2022-12-10: 1 via OPHTHALMIC

## 2022-12-10 MED ORDER — FENTANYL CITRATE (PF) 100 MCG/2ML IJ SOLN
INTRAMUSCULAR | Status: DC | PRN
  Administered 2022-12-10: 50 ug via INTRAVENOUS

## 2022-12-10 MED ORDER — TROPICAMIDE 1 % OP SOLN
1.0000 [drp] | OPHTHALMIC | Status: AC
  Administered 2022-12-10 (×3): 1 [drp] via OPHTHALMIC

## 2022-12-10 MED ORDER — SIGHTPATH DOSE#1 NA HYALUR & NA CHOND-NA HYALUR IO KIT
PACK | INTRAOCULAR | Status: DC | PRN
  Administered 2022-12-10: 1 via OPHTHALMIC

## 2022-12-10 MED ORDER — FENTANYL CITRATE (PF) 100 MCG/2ML IJ SOLN
INTRAMUSCULAR | Status: AC
  Filled 2022-12-10: qty 2

## 2022-12-10 MED ORDER — TETRACAINE HCL 0.5 % OP SOLN
1.0000 [drp] | OPHTHALMIC | Status: AC
  Administered 2022-12-10 (×3): 1 [drp] via OPHTHALMIC

## 2022-12-10 MED ORDER — POVIDONE-IODINE 5 % OP SOLN
OPHTHALMIC | Status: DC | PRN
  Administered 2022-12-10: 1 via OPHTHALMIC

## 2022-12-10 MED ORDER — STERILE WATER FOR IRRIGATION IR SOLN
Status: DC | PRN
  Administered 2022-12-10: 250 mL

## 2022-12-10 MED ORDER — PHENYLEPHRINE HCL 2.5 % OP SOLN
1.0000 [drp] | OPHTHALMIC | Status: AC
  Administered 2022-12-10 (×3): 1 [drp] via OPHTHALMIC

## 2022-12-10 MED ORDER — LIDOCAINE HCL (PF) 1 % IJ SOLN
INTRAMUSCULAR | Status: DC | PRN
  Administered 2022-12-10: 2 mL

## 2022-12-10 MED ORDER — SODIUM CHLORIDE 0.9% FLUSH
INTRAVENOUS | Status: DC | PRN
  Administered 2022-12-10: 5 mL via INTRAVENOUS

## 2022-12-10 MED ORDER — PHENYLEPHRINE-KETOROLAC 1-0.3 % IO SOLN
INTRAOCULAR | Status: DC | PRN
  Administered 2022-12-10: 500 mL via OPHTHALMIC

## 2022-12-10 MED ORDER — MIDAZOLAM HCL 2 MG/2ML IJ SOLN
INTRAMUSCULAR | Status: AC
  Filled 2022-12-10: qty 2

## 2022-12-10 SURGICAL SUPPLY — 14 items

## 2022-12-10 NOTE — Anesthesia Postprocedure Evaluation (Signed)
Anesthesia Post Note  Patient: Cassandra Davidson  Procedure(s) Performed: CATARACT EXTRACTION PHACO AND INTRAOCULAR LENS PLACEMENT (IOC) (Right: Eye)  Patient location during evaluation: Phase II Anesthesia Type: MAC Level of consciousness: awake and alert and oriented Pain management: pain level controlled Vital Signs Assessment: post-procedure vital signs reviewed and stable Respiratory status: spontaneous breathing, nonlabored ventilation and respiratory function stable Cardiovascular status: stable and blood pressure returned to baseline Postop Assessment: no apparent nausea or vomiting Anesthetic complications: no  No notable events documented.   Last Vitals:  Vitals:   12/10/22 0753 12/10/22 0845  BP: (!) 168/87 (!) 153/81  Pulse: 88 74  Resp: 18 16  Temp: 36.5 C 36.5 C  SpO2: 96% 95%    Last Pain:  Vitals:   12/10/22 0845  TempSrc: Oral  PainSc: 0-No pain                 Jaysiah Marchetta C Annemarie Sebree

## 2022-12-10 NOTE — Anesthesia Preprocedure Evaluation (Addendum)
Anesthesia Evaluation  Patient identified by MRN, date of birth, ID band Patient awake    Reviewed: Allergy & Precautions, H&P , NPO status , Patient's Chart, lab work & pertinent test results  Airway Mallampati: II  TM Distance: >3 FB Neck ROM: Full    Dental  (+) Edentulous Upper, Missing, Dental Advisory Given   Pulmonary Current SmokerPatient did not abstain from smoking.   Pulmonary exam normal breath sounds clear to auscultation       Cardiovascular hypertension, Pt. on medications Normal cardiovascular exam Rhythm:Regular Rate:Normal     Neuro/Psych  PSYCHIATRIC DISORDERS Anxiety Depression    negative neurological ROS     GI/Hepatic negative GI ROS, Neg liver ROS,,,  Endo/Other  Hypothyroidism    Renal/GU negative Renal ROS  negative genitourinary   Musculoskeletal negative musculoskeletal ROS (+)    Abdominal   Peds negative pediatric ROS (+)  Hematology negative hematology ROS (+)   Anesthesia Other Findings   Reproductive/Obstetrics negative OB ROS                             Anesthesia Physical Anesthesia Plan  ASA: 2  Anesthesia Plan: MAC   Post-op Pain Management: Minimal or no pain anticipated   Induction: Intravenous  PONV Risk Score and Plan: 0 and Treatment may vary due to age or medical condition  Airway Management Planned: Nasal Cannula and Natural Airway  Additional Equipment:   Intra-op Plan:   Post-operative Plan:   Informed Consent: I have reviewed the patients History and Physical, chart, labs and discussed the procedure including the risks, benefits and alternatives for the proposed anesthesia with the patient or authorized representative who has indicated his/her understanding and acceptance.     Dental advisory given  Plan Discussed with: CRNA and Surgeon  Anesthesia Plan Comments:        Anesthesia Quick Evaluation

## 2022-12-10 NOTE — Op Note (Signed)
Date of procedure: 12/10/22  Pre-operative diagnosis: Visually significant age-related nuclear cataract, Right Eye (H25.11)  Post-operative diagnosis: Visually significant age-related nuclear cataract, Right Eye  Procedure: Removal of cataract via phacoemulsification and insertion of intra-ocular lens J&J DIBOO +21.5D into the capsular bag of the Right Eye  Attending surgeon: Pecolia Ades, MD  Anesthesia: MAC, Topical Akten  Complications: None  Estimated Blood Loss: <51mL (minimal)  Specimens: None  Implants:  Implant Name Type Inv. Item Serial No. Manufacturer Lot No. LRB No. Used Action  TECNIS EYHANCE IOL Intraocular Lens  [id number] [employer]  Right 1 Implanted    Indications:  Visually significant age-related cataract, Right Eye  Procedure:  The patient was seen and identified in the pre-operative area. The operative eye was identified and dilated.  The operative eye was marked.  Topical anesthesia was administered to the operative eye.     The patient was then to the operative suite and placed in the supine position.  A timeout was performed confirming the patient, procedure to be performed, and all other relevant information.   The patient's face was prepped and draped in the usual fashion for intra-ocular surgery.  A lid speculum was placed into the operative eye and the surgical microscope moved into place and focused.  A superotemporal paracentesis was created using a 20 gauge paracentesis blade.  BSS mixed with Omidria, followed by 1% lidocaine was injected into the anterior chamber.  Viscoelastic was injected into the anterior chamber.  A temporal clear-corneal main wound incision was created using a 2.23mm microkeratome.  A continuous curvilinear capsulorrhexis was initiated using an irrigating cystitome and completed using capsulorrhexis forceps.  Hydrodissection and hydrodeliniation were performed.  Viscoelastic was injected into the anterior chamber.  A  phacoemulsification handpiece and a chopper as a second instrument were used to remove the nucleus and epinucleus. The irrigation/aspiration handpiece was used to remove any remaining cortical material.   The capsular bag was reinflated with viscoelastic, checked, and found to be intact.  The intraocular lens was inserted into the capsular bag.  The irrigation/aspiration handpiece was used to remove any remaining viscoelastic.  The clear corneal wound and paracentesis wounds were then hydrated and checked with Weck-Cels to be watertight.  The lid-speculum and drape was removed, and the patient's face was cleaned with a wet and dry 4x4.  Maxitrol drops were instilled onto the eye. A clear shield was taped over the eye. The patient was taken to the post-operative care unit in good condition, having tolerated the procedure well.  Post-Op Instructions: The patient will follow up at [facility] for a same day post-operative evaluation and will receive all other orders and instructions.

## 2022-12-10 NOTE — Interval H&P Note (Signed)
History and Physical Interval Note:  12/10/2022 7:52 AM  The H and P was reviewed and updated. The patient was examined.  No changes were found after exam.  The surgical eye was marked.  Cassandra Davidson

## 2022-12-10 NOTE — Discharge Instructions (Addendum)
Please discharge patient when stable, will follow up today with Dr. Faythe Heitzenrater at the [facility] office immediately following discharge.  Leave shield in place until visit.  All paperwork with discharge instructions will be given at the office.   [facility] Address:  [address]  [place], [place] [address]  Dr. Tinsleigh Slovacek's Phone: [phone]  

## 2022-12-10 NOTE — Transfer of Care (Signed)
Immediate Anesthesia Transfer of Care Note  Patient: Cassandra Davidson  Procedure(s) Performed: CATARACT EXTRACTION PHACO AND INTRAOCULAR LENS PLACEMENT (IOC) (Right: Eye)  Patient Location: Endoscopy Unit  Anesthesia Type:MAC  Level of Consciousness: awake, alert , and oriented  Airway & Oxygen Therapy: Patient Spontanous Breathing  Post-op Assessment: Report given to RN and Post -op Vital signs reviewed and stable  Post vital signs: Reviewed and stable  Last Vitals:  Vitals Value Taken Time  BP 140/73   Temp 98   Pulse 74 12/10/22 0845  Resp 16 12/10/22 0845  SpO2 95 % 12/10/22 0845    Last Pain:  Vitals:   12/10/22 0845  TempSrc: Oral  PainSc: 0-No pain         Complications: No notable events documented.

## 2022-12-10 NOTE — Anesthesia Procedure Notes (Signed)
Procedure Name: MAC Date/Time: 12/10/2022 8:29 AM  Performed by: Julian Reil, CRNAPre-anesthesia Checklist: Patient identified, Emergency Drugs available, Suction available and Patient being monitored Patient Re-evaluated:Patient Re-evaluated prior to induction Oxygen Delivery Method: Nasal cannula Placement Confirmation: positive ETCO2

## 2022-12-17 ENCOUNTER — Encounter (HOSPITAL_COMMUNITY): Payer: Self-pay | Admitting: Optometry

## 2022-12-22 NOTE — H&P (Signed)
Surgical History & Physical  Patient Name: Cassandra Davidson  DOB: 1961-02-21  Surgery: Cataract extraction with intraocular lens implant phacoemulsification; Left Eye Surgeon: Pecolia Ades MD Surgery Date: 12/31/2022 Pre-Op Date: 12/15/2022  HPI: A 37 Yr. old female patient present for 5 day post op OD. Patient is doing well. At times eye feels swollen. Using Ciproflox QID and Pred QID OD. Difficulties reading fine print, watching TV, difficulties driving during the day and at night due to glare. This is negatively affecting the patient's quality of life and the patient is unable to function adequately in life with the current level of vision. Patient would like to proceed with cataract sx OS.  Medical History: Cataracts  Arthritis High Blood Pressure Stroke anxiety, depression, neurofibromatosis: Von Recklinghausen's disease  Review of Systems Psychiatry Anxiety, Depression All recorded systems are negative except as noted above.  Social Current every day smoker of Vape   Medication Ciprofloxacin, Prednisolone acetate 1%,  Alprazolam, Bupropion HCL, Escitalopram, Atorvastatin, Aripiprazole  Sx/Procedures Phaco c IOL OD,  Hysterectomy, Tubal Ligation, Gallbladder Sx, Thyroid Removal  Drug Allergies  Codeine  History & Physical: Heent: PCL OD, cataract OS NECK: supple without bruits LUNGS: lungs clear to auscultation CV: regular rate and rhythm Abdomen: soft and non-tender  Impression & Plan: Assessment: 1.  CATARACT EXTRACTION STATUS; Right Eye (Z98.41) 2.  INTRAOCULAR LENS IOL ; Right Eye (Z96.1) 3. CATARACT AGE-RELATED NUCLEAR; Left Eye (H25.12)  Plan: 1.  POD5 exam. Doing well. All post-op precautions discussed and instructions reviewed. Written instructions given.  2.   See above  3.   Cataracts are visually significant and account for the patient's complaints. Discussed all risks, benefits, procedures and recovery, including infection, loss of vision and  eye, need for glasses after surgery or additional procedures. Patient understands changing glasses will not improve vision. Patient indicated understanding of procedure. All questions answered. Patient desires to have surgery, recommend phacoemulsification with intraocular lens. Patient to have preliminary testing necessary (Argos/IOL Master, Mac OCT, TOPO) Educational materials provided: Cataract. RECOMMEND STANDARD  Plan: - proceed with cataract surgery OS - best distance target OU, RayOne Lenses OU - ok with lying flat, no prior eye surgeries - NF1, no apparent phacodonesis

## 2022-12-24 DIAGNOSIS — H2512 Age-related nuclear cataract, left eye: Secondary | ICD-10-CM | POA: Diagnosis not present

## 2022-12-28 ENCOUNTER — Encounter (HOSPITAL_COMMUNITY): Payer: Self-pay

## 2022-12-28 ENCOUNTER — Encounter (HOSPITAL_COMMUNITY)
Admission: RE | Admit: 2022-12-28 | Discharge: 2022-12-28 | Disposition: A | Discharge status: Home / Self Care | Payer: BC Managed Care – PPO | Source: Ambulatory Visit | Attending: Optometry | Admitting: Optometry

## 2022-12-28 ENCOUNTER — Other Ambulatory Visit: Payer: Self-pay

## 2022-12-31 ENCOUNTER — Encounter (HOSPITAL_COMMUNITY)
Admission: RE | Disposition: A | Discharge status: Home / Self Care | Payer: Self-pay | Source: Home / Self Care | Attending: Optometry

## 2022-12-31 ENCOUNTER — Ambulatory Visit (HOSPITAL_COMMUNITY)
Admission: RE | Admit: 2022-12-31 | Discharge: 2022-12-31 | Disposition: A | Discharge status: Home / Self Care | Payer: BC Managed Care – PPO | Attending: Optometry | Admitting: Optometry

## 2022-12-31 ENCOUNTER — Ambulatory Visit (HOSPITAL_COMMUNITY): Payer: BC Managed Care – PPO | Admitting: Anesthesiology

## 2022-12-31 DIAGNOSIS — Q8501 Neurofibromatosis, type 1: Secondary | ICD-10-CM | POA: Diagnosis not present

## 2022-12-31 DIAGNOSIS — F419 Anxiety disorder, unspecified: Secondary | ICD-10-CM | POA: Insufficient documentation

## 2022-12-31 DIAGNOSIS — F17290 Nicotine dependence, other tobacco product, uncomplicated: Secondary | ICD-10-CM | POA: Insufficient documentation

## 2022-12-31 DIAGNOSIS — F32A Depression, unspecified: Secondary | ICD-10-CM | POA: Insufficient documentation

## 2022-12-31 DIAGNOSIS — E039 Hypothyroidism, unspecified: Secondary | ICD-10-CM | POA: Insufficient documentation

## 2022-12-31 DIAGNOSIS — Z79899 Other long term (current) drug therapy: Secondary | ICD-10-CM | POA: Insufficient documentation

## 2022-12-31 DIAGNOSIS — H2512 Age-related nuclear cataract, left eye: Secondary | ICD-10-CM | POA: Insufficient documentation

## 2022-12-31 DIAGNOSIS — I10 Essential (primary) hypertension: Secondary | ICD-10-CM | POA: Diagnosis not present

## 2022-12-31 HISTORY — PX: CATARACT EXTRACTION W/PHACO: SHX586

## 2022-12-31 SURGERY — PHACOEMULSIFICATION, CATARACT, WITH IOL INSERTION
Anesthesia: Monitor Anesthesia Care | Site: Eye | Laterality: Left

## 2022-12-31 MED ORDER — SIGHTPATH DOSE#1 NA HYALUR & NA CHOND-NA HYALUR IO KIT
PACK | INTRAOCULAR | Status: DC | PRN
  Administered 2022-12-31: 1 via OPHTHALMIC

## 2022-12-31 MED ORDER — TROPICAMIDE 1 % OP SOLN
1.0000 [drp] | OPHTHALMIC | Status: AC | PRN
  Administered 2022-12-31 (×3): 1 [drp] via OPHTHALMIC

## 2022-12-31 MED ORDER — PHENYLEPHRINE HCL 2.5 % OP SOLN
1.0000 [drp] | OPHTHALMIC | Status: AC | PRN
  Administered 2022-12-31 (×3): 1 [drp] via OPHTHALMIC

## 2022-12-31 MED ORDER — STERILE WATER FOR IRRIGATION IR SOLN
Status: DC | PRN
  Administered 2022-12-31: 25 mL

## 2022-12-31 MED ORDER — NEOMYCIN-POLYMYXIN-DEXAMETH 3.5-10000-0.1 OP SUSP
OPHTHALMIC | Status: DC | PRN
  Administered 2022-12-31: 2 [drp] via OPHTHALMIC

## 2022-12-31 MED ORDER — MIDAZOLAM HCL 2 MG/2ML IJ SOLN
INTRAMUSCULAR | Status: DC | PRN
  Administered 2022-12-31: 2 mg via INTRAVENOUS

## 2022-12-31 MED ORDER — LIDOCAINE HCL (PF) 1 % IJ SOLN
INTRAMUSCULAR | Status: DC | PRN
  Administered 2022-12-31: 1 mL

## 2022-12-31 MED ORDER — PHENYLEPHRINE-KETOROLAC 1-0.3 % IO SOLN
INTRAOCULAR | Status: DC | PRN
  Administered 2022-12-31: 500 mL via OPHTHALMIC

## 2022-12-31 MED ORDER — SODIUM CHLORIDE 0.9% FLUSH
INTRAVENOUS | Status: DC | PRN
  Administered 2022-12-31: 7 mL via INTRAVENOUS

## 2022-12-31 MED ORDER — TETRACAINE HCL 0.5 % OP SOLN
1.0000 [drp] | OPHTHALMIC | Status: AC | PRN
  Administered 2022-12-31 (×3): 1 [drp] via OPHTHALMIC

## 2022-12-31 MED ORDER — MIDAZOLAM HCL 2 MG/2ML IJ SOLN
INTRAMUSCULAR | Status: AC
  Filled 2022-12-31: qty 2

## 2022-12-31 MED ORDER — POVIDONE-IODINE 5 % OP SOLN
OPHTHALMIC | Status: DC | PRN
  Administered 2022-12-31: 1 via OPHTHALMIC

## 2022-12-31 MED ORDER — FENTANYL CITRATE (PF) 100 MCG/2ML IJ SOLN
INTRAMUSCULAR | Status: DC | PRN
  Administered 2022-12-31: 50 ug via INTRAVENOUS

## 2022-12-31 MED ORDER — LIDOCAINE HCL 3.5 % OP GEL
1.0000 | Freq: Once | OPHTHALMIC | Status: AC
  Administered 2022-12-31: 1 via OPHTHALMIC

## 2022-12-31 MED ORDER — BSS IO SOLN
INTRAOCULAR | Status: DC | PRN
  Administered 2022-12-31: 15 mL via INTRAOCULAR

## 2022-12-31 MED ORDER — FENTANYL CITRATE (PF) 100 MCG/2ML IJ SOLN
INTRAMUSCULAR | Status: AC
  Filled 2022-12-31: qty 2

## 2022-12-31 SURGICAL SUPPLY — 13 items
CATARACT SUITE SIGHTPATH (MISCELLANEOUS) ×1 IMPLANT
CLOTH BEACON ORANGE TIMEOUT ST (SAFETY) ×1 IMPLANT
DRSG TEGADERM 4X4.75 (GAUZE/BANDAGES/DRESSINGS) ×1 IMPLANT
EYE SHIELD UNIVERSAL CLEAR (GAUZE/BANDAGES/DRESSINGS) IMPLANT
FEE CATARACT SUITE SIGHTPATH (MISCELLANEOUS) ×1 IMPLANT
GLOVE BIOGEL PI IND STRL 7.0 (GLOVE) ×2 IMPLANT
LENS IOL TECNIS EYHANCE 20.0 (Intraocular Lens) IMPLANT
NDL HYPO 18GX1.5 BLUNT FILL (NEEDLE) ×1 IMPLANT
NEEDLE HYPO 18GX1.5 BLUNT FILL (NEEDLE) ×1 IMPLANT
PAD ARMBOARD 7.5X6 YLW CONV (MISCELLANEOUS) ×1 IMPLANT
SYR TB 1ML LL NO SAFETY (SYRINGE) ×1 IMPLANT
TAPE SURG TRANSPORE 1 IN (GAUZE/BANDAGES/DRESSINGS) IMPLANT
WATER STERILE IRR 250ML POUR (IV SOLUTION) ×1 IMPLANT

## 2022-12-31 NOTE — Op Note (Signed)
Date of procedure: 12/31/22  Pre-operative diagnosis: Visually significant age-related nuclear cataract, Left Eye (H25.12)  Post-operative diagnosis: Visually significant age-related nuclear cataract, Left Eye  Procedure: Removal of cataract via phacoemulsification and insertion of intra-ocular lens J&J DIB00 +20.0D into the capsular bag of the Left Eye  Attending surgeon: Ronal Fear, MD  Anesthesia: MAC, Topical Akten  Complications: None  Estimated Blood Loss: <59mL (minimal)  Specimens: None  Implants:  Implant Name Type Inv. Item Serial No. Manufacturer Lot No. LRB No. Used Action  LENS IOL TECNIS EYHANCE 20.0 - [id number] Intraocular Lens LENS IOL TECNIS EYHANCE 20.0 [id number] [employer]  Left 1 Implanted    Indications:  Visually significant age-related cataract, Left Eye  Procedure:  The patient was seen and identified in the pre-operative area. The operative eye was identified and dilated.  The operative eye was marked.  Topical anesthesia was administered to the operative eye.     The patient was then to the operative suite and placed in the supine position.  A timeout was performed confirming the patient, procedure to be performed, and all other relevant information.   The patient's face was prepped and draped in the usual fashion for intra-ocular surgery.  A lid speculum was placed into the operative eye and the surgical microscope moved into place and focused.  An inferotemporal paracentesis was created using a 20 gauge paracentesis blade.  BSS mixed with Omidria, followed by 1% lidocaine was injected into the anterior chamber.  Viscoelastic was injected into the anterior chamber.  A temporal clear-corneal main wound incision was created using a 2.70mm microkeratome.  A continuous curvilinear capsulorrhexis was initiated using an irrigating cystitome and completed using capsulorrhexis forceps.  Hydrodissection and hydrodeliniation were performed.  Viscoelastic was  injected into the anterior chamber.  A phacoemulsification handpiece and a chopper as a second instrument were used to remove the nucleus and epinucleus. The irrigation/aspiration handpiece was used to remove any remaining cortical material.   The capsular bag was reinflated with viscoelastic, checked, and found to be intact.  The intraocular lens was inserted into the capsular bag.  The irrigation/aspiration handpiece was used to remove any remaining viscoelastic.  The clear corneal wound and paracentesis wounds were then hydrated and checked with Weck-Cels to be watertight.  The lid-speculum and drape was removed, and the patient's face was cleaned with a wet and dry 4x4.  Maxitrol drops were instilled onto the eye. A clear shield was taped over the eye. The patient was taken to the post-operative care unit in good condition, having tolerated the procedure well.  Post-Op Instructions: The patient will follow up at [facility] for a same day post-operative evaluation and will receive all other orders and instructions.

## 2022-12-31 NOTE — Transfer of Care (Addendum)
Immediate Anesthesia Transfer of Care Note  Patient: Cassandra Davidson  Procedure(s) Performed: CATARACT EXTRACTION PHACO AND INTRAOCULAR LENS PLACEMENT (IOC) (Left: Eye)  Patient Location: PACU and Short Stay  Anesthesia Type:MAC  Level of Consciousness: awake and patient cooperative  Airway & Oxygen Therapy: Patient Spontanous Breathing  Post-op Assessment: Report given to RN and Post -op Vital signs reviewed and stable  Post vital signs: Reviewed and stable  Last Vitals:  Vitals Value Taken Time  BP 147/88 12/31/2022   1203  Temp 36.6 12/31/2022   1203  Pulse 71 12/31/2022   1203  Resp 16 12/31/2022   1203  SpO2 96% 12/31/2022   1203    Last Pain:  Vitals:   12/31/22 1050  PainSc: 0-No pain         Complications: No notable events documented.

## 2022-12-31 NOTE — Anesthesia Postprocedure Evaluation (Signed)
Anesthesia Post Note  Patient: Cassandra Davidson  Procedure(s) Performed: CATARACT EXTRACTION PHACO AND INTRAOCULAR LENS PLACEMENT (IOC) (Left: Eye)  Patient location during evaluation: Phase II Anesthesia Type: MAC Level of consciousness: awake and alert and oriented Pain management: pain level controlled Vital Signs Assessment: post-procedure vital signs reviewed and stable Respiratory status: spontaneous breathing, nonlabored ventilation and respiratory function stable Cardiovascular status: stable and blood pressure returned to baseline Postop Assessment: no apparent nausea or vomiting Anesthetic complications: no  No notable events documented.   Last Vitals:  Vitals:   12/31/22 1050 12/31/22 1203  BP: (!) 148/86 (!) 147/88  Pulse: 82 71  Resp: 14 16  Temp: 36.6 C 36.6 C  SpO2: 97% 96%    Last Pain:  Vitals:   12/31/22 1203  TempSrc: Oral  PainSc: 0-No pain                 Fidela Cieslak C Jivan Symanski

## 2022-12-31 NOTE — Anesthesia Preprocedure Evaluation (Signed)
Anesthesia Evaluation  Patient identified by MRN, date of birth, ID band Patient awake    Reviewed: Allergy & Precautions, H&P , NPO status , Patient's Chart, lab work & pertinent test results  Airway Mallampati: II  TM Distance: >3 FB Neck ROM: Full    Dental  (+) Edentulous Upper, Missing, Dental Advisory Given   Pulmonary Current SmokerPatient did not abstain from smoking.   Pulmonary exam normal breath sounds clear to auscultation       Cardiovascular hypertension, Pt. on medications Normal cardiovascular exam Rhythm:Regular Rate:Normal     Neuro/Psych  PSYCHIATRIC DISORDERS Anxiety Depression    negative neurological ROS     GI/Hepatic negative GI ROS, Neg liver ROS,,,  Endo/Other  Hypothyroidism    Renal/GU negative Renal ROS  negative genitourinary   Musculoskeletal negative musculoskeletal ROS (+)    Abdominal   Peds negative pediatric ROS (+)  Hematology negative hematology ROS (+)   Anesthesia Other Findings   Reproductive/Obstetrics negative OB ROS                             Anesthesia Physical Anesthesia Plan  ASA: 2  Anesthesia Plan: MAC   Post-op Pain Management: Minimal or no pain anticipated   Induction: Intravenous  PONV Risk Score and Plan: 0 and Treatment may vary due to age or medical condition  Airway Management Planned: Nasal Cannula and Natural Airway  Additional Equipment:   Intra-op Plan:   Post-operative Plan:   Informed Consent: I have reviewed the patients History and Physical, chart, labs and discussed the procedure including the risks, benefits and alternatives for the proposed anesthesia with the patient or authorized representative who has indicated his/her understanding and acceptance.     Dental advisory given  Plan Discussed with: CRNA and Surgeon  Anesthesia Plan Comments:        Anesthesia Quick Evaluation  

## 2022-12-31 NOTE — Interval H&P Note (Signed)
History and Physical Interval Note:  12/31/2022 11:12 AM  The H and P was reviewed and updated. The patient was examined.  No changes were found after exam.  The surgical eye was marked.  Cassandra Davidson

## 2022-12-31 NOTE — Discharge Instructions (Signed)
Please discharge patient when stable, will follow up today with Dr. Christabell Loseke at the [facility] [place] office immediately following discharge.  Leave shield in place until visit.  All paperwork with discharge instructions will be given at the office.  [facility] Address:  [address]  [place], [place] [address]  Dr. Antoney Biven's Phone: [phone]  

## 2023-01-05 ENCOUNTER — Encounter (HOSPITAL_COMMUNITY): Payer: Self-pay | Admitting: Optometry

## 2023-03-16 DIAGNOSIS — Z0001 Encounter for general adult medical examination with abnormal findings: Secondary | ICD-10-CM | POA: Diagnosis not present

## 2023-03-16 DIAGNOSIS — E559 Vitamin D deficiency, unspecified: Secondary | ICD-10-CM | POA: Diagnosis not present

## 2023-03-16 DIAGNOSIS — Q8500 Neurofibromatosis, unspecified: Secondary | ICD-10-CM | POA: Diagnosis not present

## 2023-03-16 DIAGNOSIS — E6609 Other obesity due to excess calories: Secondary | ICD-10-CM | POA: Diagnosis not present

## 2023-03-16 DIAGNOSIS — Z1331 Encounter for screening for depression: Secondary | ICD-10-CM | POA: Diagnosis not present

## 2023-03-16 DIAGNOSIS — I10 Essential (primary) hypertension: Secondary | ICD-10-CM | POA: Diagnosis not present

## 2023-03-16 DIAGNOSIS — F419 Anxiety disorder, unspecified: Secondary | ICD-10-CM | POA: Diagnosis not present

## 2023-03-16 DIAGNOSIS — D518 Other vitamin B12 deficiency anemias: Secondary | ICD-10-CM | POA: Diagnosis not present

## 2023-03-16 DIAGNOSIS — G9332 Myalgic encephalomyelitis/chronic fatigue syndrome: Secondary | ICD-10-CM | POA: Diagnosis not present

## 2023-03-16 DIAGNOSIS — M1990 Unspecified osteoarthritis, unspecified site: Secondary | ICD-10-CM | POA: Diagnosis not present

## 2023-03-16 DIAGNOSIS — M1991 Primary osteoarthritis, unspecified site: Secondary | ICD-10-CM | POA: Diagnosis not present

## 2023-03-16 DIAGNOSIS — F321 Major depressive disorder, single episode, moderate: Secondary | ICD-10-CM | POA: Diagnosis not present

## 2023-03-16 DIAGNOSIS — Z6837 Body mass index (BMI) 37.0-37.9, adult: Secondary | ICD-10-CM | POA: Diagnosis not present

## 2023-04-20 ENCOUNTER — Other Ambulatory Visit: Payer: Self-pay | Admitting: Orthopedic Surgery

## 2023-04-20 DIAGNOSIS — M25561 Pain in right knee: Secondary | ICD-10-CM

## 2023-07-28 DIAGNOSIS — I10 Essential (primary) hypertension: Secondary | ICD-10-CM | POA: Diagnosis not present

## 2023-07-28 DIAGNOSIS — M1990 Unspecified osteoarthritis, unspecified site: Secondary | ICD-10-CM | POA: Diagnosis not present

## 2023-07-28 DIAGNOSIS — E6609 Other obesity due to excess calories: Secondary | ICD-10-CM | POA: Diagnosis not present

## 2023-07-28 DIAGNOSIS — M1991 Primary osteoarthritis, unspecified site: Secondary | ICD-10-CM | POA: Diagnosis not present

## 2023-07-28 DIAGNOSIS — Z6835 Body mass index (BMI) 35.0-35.9, adult: Secondary | ICD-10-CM | POA: Diagnosis not present

## 2023-07-28 DIAGNOSIS — F419 Anxiety disorder, unspecified: Secondary | ICD-10-CM | POA: Diagnosis not present

## 2023-07-28 DIAGNOSIS — Q8501 Neurofibromatosis, type 1: Secondary | ICD-10-CM | POA: Diagnosis not present

## 2023-07-28 DIAGNOSIS — Q8500 Neurofibromatosis, unspecified: Secondary | ICD-10-CM | POA: Diagnosis not present

## 2023-10-19 ENCOUNTER — Other Ambulatory Visit: Payer: Self-pay | Admitting: Orthopedic Surgery

## 2023-10-19 DIAGNOSIS — M25561 Pain in right knee: Secondary | ICD-10-CM

## 2023-10-28 DIAGNOSIS — Z6835 Body mass index (BMI) 35.0-35.9, adult: Secondary | ICD-10-CM | POA: Diagnosis not present

## 2023-10-28 DIAGNOSIS — F321 Major depressive disorder, single episode, moderate: Secondary | ICD-10-CM | POA: Diagnosis not present

## 2023-10-28 DIAGNOSIS — M1991 Primary osteoarthritis, unspecified site: Secondary | ICD-10-CM | POA: Diagnosis not present

## 2023-10-28 DIAGNOSIS — M1990 Unspecified osteoarthritis, unspecified site: Secondary | ICD-10-CM | POA: Diagnosis not present

## 2023-10-28 DIAGNOSIS — L309 Dermatitis, unspecified: Secondary | ICD-10-CM | POA: Diagnosis not present

## 2023-10-28 DIAGNOSIS — Q8500 Neurofibromatosis, unspecified: Secondary | ICD-10-CM | POA: Diagnosis not present

## 2023-10-28 DIAGNOSIS — I10 Essential (primary) hypertension: Secondary | ICD-10-CM | POA: Diagnosis not present

## 2023-10-28 DIAGNOSIS — E6609 Other obesity due to excess calories: Secondary | ICD-10-CM | POA: Diagnosis not present

## 2023-10-28 DIAGNOSIS — Q8501 Neurofibromatosis, type 1: Secondary | ICD-10-CM | POA: Diagnosis not present

## 2023-10-28 DIAGNOSIS — F419 Anxiety disorder, unspecified: Secondary | ICD-10-CM | POA: Diagnosis not present

## 2024-01-25 DIAGNOSIS — H04123 Dry eye syndrome of bilateral lacrimal glands: Secondary | ICD-10-CM | POA: Diagnosis not present

## 2024-01-25 DIAGNOSIS — Z961 Presence of intraocular lens: Secondary | ICD-10-CM | POA: Diagnosis not present

## 2024-01-25 DIAGNOSIS — Q8501 Neurofibromatosis, type 1: Secondary | ICD-10-CM | POA: Diagnosis not present

## 2024-01-25 DIAGNOSIS — H16223 Keratoconjunctivitis sicca, not specified as Sjogren's, bilateral: Secondary | ICD-10-CM | POA: Diagnosis not present

## 2024-01-26 ENCOUNTER — Ambulatory Visit: Admitting: Orthopedic Surgery

## 2024-01-26 DIAGNOSIS — M170 Bilateral primary osteoarthritis of knee: Secondary | ICD-10-CM

## 2024-01-26 DIAGNOSIS — M1712 Unilateral primary osteoarthritis, left knee: Secondary | ICD-10-CM

## 2024-01-26 DIAGNOSIS — G8929 Other chronic pain: Secondary | ICD-10-CM

## 2024-01-26 DIAGNOSIS — M1711 Unilateral primary osteoarthritis, right knee: Secondary | ICD-10-CM

## 2024-01-26 MED ORDER — METHYLPREDNISOLONE ACETATE 40 MG/ML IJ SUSP
40.0000 mg | Freq: Once | INTRAMUSCULAR | Status: AC
  Administered 2024-01-26: 40 mg via INTRA_ARTICULAR

## 2024-01-26 NOTE — Progress Notes (Signed)
 Chief Complaint  Patient presents with   Injections    Encounter Diagnoses  Name Primary?   Primary osteoarthritis of right knee Yes   Primary osteoarthritis of left knee    Chronic pain of right knee    Chronic pain of left knee    63 yo F req B/L Knee injx   Procedure note for bilateral knee injections  Procedure note left knee injection verbal consent was obtained to inject left knee joint  Timeout was completed to confirm the site of injection  The medications used were 40 mg depomedrol and 3 cc of 1% lidocaine   Anesthesia was provided by ethyl chloride and the skin was prepped with alcohol.  After cleaning the skin with alcohol a 20-gauge needle was used to inject the left knee joint. There were no complications. A sterile bandage was applied.   Procedure note right knee injection verbal consent was obtained to inject right knee joint  Timeout was completed to confirm the site of injection  The medications used were 40 mg depomedrol and 3 cc of 1% lidocaine   Anesthesia was provided by ethyl chloride and the skin was prepped with alcohol.  After cleaning the skin with alcohol a 20-gauge needle was used to inject the right knee joint. There were no complications. A sterile bandage was applied.

## 2024-02-06 DIAGNOSIS — I10 Essential (primary) hypertension: Secondary | ICD-10-CM | POA: Diagnosis not present

## 2024-02-06 DIAGNOSIS — M1990 Unspecified osteoarthritis, unspecified site: Secondary | ICD-10-CM | POA: Diagnosis not present

## 2024-02-06 DIAGNOSIS — Q8500 Neurofibromatosis, unspecified: Secondary | ICD-10-CM | POA: Diagnosis not present

## 2024-04-16 ENCOUNTER — Other Ambulatory Visit: Payer: Self-pay | Admitting: Orthopedic Surgery

## 2024-04-16 DIAGNOSIS — M25561 Pain in right knee: Secondary | ICD-10-CM

## 2024-04-30 DIAGNOSIS — H903 Sensorineural hearing loss, bilateral: Secondary | ICD-10-CM | POA: Diagnosis not present
